# Patient Record
Sex: Male | Born: 1950
Health system: Southern US, Community
[De-identification: ages and names within clinical notes are randomized; demographics above are authoritative.]

## PROBLEM LIST (undated history)

## (undated) DIAGNOSIS — Z8709 Personal history of other diseases of the respiratory system: Secondary | ICD-10-CM

## (undated) DIAGNOSIS — M51379 Other intervertebral disc degeneration, lumbosacral region without mention of lumbar back pain or lower extremity pain: Secondary | ICD-10-CM

## (undated) DIAGNOSIS — M1990 Unspecified osteoarthritis, unspecified site: Secondary | ICD-10-CM

## (undated) DIAGNOSIS — C801 Malignant (primary) neoplasm, unspecified: Secondary | ICD-10-CM

## (undated) DIAGNOSIS — M949 Disorder of cartilage, unspecified: Secondary | ICD-10-CM

## (undated) DIAGNOSIS — R350 Frequency of micturition: Secondary | ICD-10-CM

## (undated) DIAGNOSIS — Z9109 Other allergy status, other than to drugs and biological substances: Secondary | ICD-10-CM

## (undated) DIAGNOSIS — R51 Headache: Secondary | ICD-10-CM

## (undated) DIAGNOSIS — R519 Headache, unspecified: Secondary | ICD-10-CM

## (undated) DIAGNOSIS — M5137 Other intervertebral disc degeneration, lumbosacral region: Secondary | ICD-10-CM

## (undated) DIAGNOSIS — J189 Pneumonia, unspecified organism: Secondary | ICD-10-CM

## (undated) DIAGNOSIS — Z972 Presence of dental prosthetic device (complete) (partial): Secondary | ICD-10-CM

## (undated) DIAGNOSIS — K219 Gastro-esophageal reflux disease without esophagitis: Secondary | ICD-10-CM

## (undated) DIAGNOSIS — M899 Disorder of bone, unspecified: Secondary | ICD-10-CM

## (undated) DIAGNOSIS — H269 Unspecified cataract: Secondary | ICD-10-CM

## (undated) HISTORY — DX: Gastro-esophageal reflux disease without esophagitis: K21.9

## (undated) HISTORY — DX: Other intervertebral disc degeneration, lumbosacral region: M51.37

## (undated) HISTORY — PX: VASECTOMY: SHX75

## (undated) HISTORY — DX: Other intervertebral disc degeneration, lumbosacral region without mention of lumbar back pain or lower extremity pain: M51.379

## (undated) HISTORY — PX: DENTAL SURGERY: SHX609

## (undated) HISTORY — DX: Unspecified osteoarthritis, unspecified site: M19.90

---

## 1999-07-21 ENCOUNTER — Encounter: Admission: RE | Admit: 1999-07-21 | Discharge: 1999-07-21 | Payer: Self-pay | Admitting: *Deleted

## 1999-07-21 ENCOUNTER — Encounter: Payer: Self-pay | Admitting: *Deleted

## 2002-03-05 HISTORY — PX: COLONOSCOPY: SHX174

## 2002-03-17 ENCOUNTER — Ambulatory Visit (HOSPITAL_COMMUNITY): Admission: RE | Admit: 2002-03-17 | Discharge: 2002-03-17 | Payer: Self-pay | Admitting: *Deleted

## 2014-03-13 DIAGNOSIS — J309 Allergic rhinitis, unspecified: Secondary | ICD-10-CM | POA: Diagnosis not present

## 2014-03-13 DIAGNOSIS — J0110 Acute frontal sinusitis, unspecified: Secondary | ICD-10-CM | POA: Diagnosis not present

## 2016-03-03 DIAGNOSIS — J321 Chronic frontal sinusitis: Secondary | ICD-10-CM | POA: Diagnosis not present

## 2016-03-03 DIAGNOSIS — Z23 Encounter for immunization: Secondary | ICD-10-CM | POA: Diagnosis not present

## 2016-04-10 ENCOUNTER — Ambulatory Visit
Admission: RE | Admit: 2016-04-10 | Discharge: 2016-04-10 | Disposition: A | Discharge status: Home / Self Care | Payer: Medicare Other | Source: Ambulatory Visit | Attending: Physician Assistant | Admitting: Physician Assistant

## 2016-04-10 ENCOUNTER — Other Ambulatory Visit: Payer: Self-pay | Admitting: Physician Assistant

## 2016-04-10 DIAGNOSIS — M25571 Pain in right ankle and joints of right foot: Secondary | ICD-10-CM

## 2016-04-10 DIAGNOSIS — E786 Lipoprotein deficiency: Secondary | ICD-10-CM | POA: Diagnosis not present

## 2016-04-10 DIAGNOSIS — Z0000 Encounter for general adult medical examination without abnormal findings: Secondary | ICD-10-CM | POA: Diagnosis not present

## 2016-04-10 DIAGNOSIS — Z23 Encounter for immunization: Secondary | ICD-10-CM | POA: Diagnosis not present

## 2016-04-10 DIAGNOSIS — Z87891 Personal history of nicotine dependence: Secondary | ICD-10-CM | POA: Diagnosis not present

## 2016-04-10 DIAGNOSIS — M19071 Primary osteoarthritis, right ankle and foot: Secondary | ICD-10-CM | POA: Diagnosis not present

## 2016-04-11 ENCOUNTER — Other Ambulatory Visit: Payer: Self-pay | Admitting: Physician Assistant

## 2016-04-11 DIAGNOSIS — Z136 Encounter for screening for cardiovascular disorders: Secondary | ICD-10-CM

## 2016-04-20 ENCOUNTER — Ambulatory Visit: Payer: Medicare Other

## 2016-04-24 ENCOUNTER — Other Ambulatory Visit: Payer: Self-pay | Admitting: Physician Assistant

## 2016-04-24 ENCOUNTER — Ambulatory Visit
Admission: RE | Admit: 2016-04-24 | Discharge: 2016-04-24 | Disposition: A | Discharge status: Home / Self Care | Payer: Medicare Other | Source: Ambulatory Visit | Attending: Physician Assistant | Admitting: Physician Assistant

## 2016-04-24 DIAGNOSIS — Z87891 Personal history of nicotine dependence: Secondary | ICD-10-CM | POA: Diagnosis not present

## 2016-04-24 DIAGNOSIS — Z136 Encounter for screening for cardiovascular disorders: Secondary | ICD-10-CM

## 2016-04-25 ENCOUNTER — Telehealth: Payer: Self-pay

## 2016-04-25 NOTE — Telephone Encounter (Signed)
SENT NOTES TO SCHEDULING 

## 2016-05-01 ENCOUNTER — Encounter: Payer: Self-pay | Admitting: *Deleted

## 2016-05-01 ENCOUNTER — Telehealth: Payer: Self-pay | Admitting: *Deleted

## 2016-05-01 NOTE — Telephone Encounter (Signed)
NOTES SENT TO SCHEDULING ON 05/01/16.

## 2016-05-02 ENCOUNTER — Encounter: Payer: Self-pay | Admitting: Cardiovascular Disease

## 2016-05-02 ENCOUNTER — Ambulatory Visit (INDEPENDENT_AMBULATORY_CARE_PROVIDER_SITE_OTHER): Payer: Medicare Other | Admitting: Cardiovascular Disease

## 2016-05-02 DIAGNOSIS — G8929 Other chronic pain: Secondary | ICD-10-CM | POA: Diagnosis not present

## 2016-05-02 DIAGNOSIS — M25571 Pain in right ankle and joints of right foot: Secondary | ICD-10-CM | POA: Diagnosis not present

## 2016-05-02 NOTE — Patient Instructions (Signed)
Medication Instructions: Your physician recommends that you continue on your current medications as directed. Please refer to the Current Medication list given to you today.   Follow-Up: Your physician recommends that you schedule a follow-up appointment as needed with Dr. Berry.  If you need a refill on your cardiac medications before your next appointment, please call your pharmacy.  

## 2016-05-02 NOTE — Assessment & Plan Note (Signed)
David Dillon was referred to me for evaluation of right ankle pain because of radiographic evidence of arterial calcification in his right ankle. He has no cardiac risk factors. His ankle hurt at any time mostly in the mornings before he walks on it gets better throughout the day. He denies claudication symptoms. Radiographs suggested calcification of his tibial vessels. On exam today his blood bounding pedal pulses. I do not think his symptoms are vascular in nature.

## 2016-05-02 NOTE — Progress Notes (Signed)
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 He is referred for evaluation of chronic episodic right ankle pain. He really has no chronic or factors other than family history with a father who had bypass surgery in his 18s. He has never had a heart attack or stroke. He denies chest pain or shortness of breath. He is fairly active and used to run 5K's and 40 miles a week. He only injured his ankle 5 years ago and gets episodic right ankle pain which gets better with ambulation. Radiograph recently performed by his PCP suggested vascular calcification and he was referred here for further evaluation. He denies claudication.   Current Outpatient Prescriptions  Medication Sig Dispense Refill  . cetirizine (ZYRTEC) 10 MG tablet Take 10 mg by mouth daily as needed for allergies.    . fluticasone (FLONASE) 50 MCG/ACT nasal spray Place 2 sprays into both nostrils daily as needed for allergies or rhinitis.    . ranitidine (ZANTAC) 150 MG capsule Take 150 mg by mouth daily as needed for heartburn.     No current facility-administered medications for this visit.     No Known Allergies  Social History   Social History  . Marital status: Married    Spouse name: N/A  . Number of children: 3  . Years of education: N/A   Occupational History  . Not on file.   Social History Main Topics  . Smoking status: Former Smoker    Types: Cigarettes  . Smokeless tobacco: Never Used     Comment: quit in his early 20's  . Alcohol use Yes     Comment: daily  . Drug use: No  . Sexual activity: Not on file   Other Topics Concern  . Not on file   Social History Narrative  . No narrative  on file     Review of Systems: General: negative for chills, fever, night sweats or weight changes.  Cardiovascular: negative for chest pain, dyspnea on exertion, edema, orthopnea, palpitations, paroxysmal nocturnal dyspnea or shortness of breath Dermatological: negative for rash Respiratory: negative for cough or wheezing Urologic: negative for hematuria Abdominal: negative for nausea, vomiting, diarrhea, bright red blood per rectum, melena, or hematemesis Neurologic: negative for visual changes, syncope, or dizziness All other systems reviewed and are otherwise negative except as noted above.    There were no vitals taken for this visit.  General appearance: alert and no distress Neck: no adenopathy, no carotid bruit, no JVD, supple, symmetrical, trachea midline and thyroid not enlarged, symmetric, no tenderness/mass/nodules Lungs: clear to auscultation bilaterally Heart: regular rate and rhythm, S1, S2 normal, no murmur, click, rub or gallop Extremities: extremities normal, atraumatic, no cyanosis or edema and 2+ right pedal pulses  EKG sinus rhythm at 65 with left axis deviation. I personally reviewed this EKG  ASSESSMENT AND PLAN:   Right ankle pain Mr. Groman was referred to me for evaluation of right ankle pain because of radiographic evidence of arterial calcification in his right ankle. He has no cardiac risk factors. His ankle hurt at any time mostly in the mornings before he walks on it gets better throughout the day. He denies claudication symptoms. Radiographs suggested  calcification of his tibial vessels. On exam today his blood bounding pedal pulses. I do not think his symptoms are vascular in nature.      Lorretta Harp MD FACP,FACC,FAHA, [facility] 05/02/2016 10:50 AM

## 2017-01-25 DIAGNOSIS — J019 Acute sinusitis, unspecified: Secondary | ICD-10-CM | POA: Diagnosis not present

## 2017-02-04 DIAGNOSIS — Z23 Encounter for immunization: Secondary | ICD-10-CM | POA: Diagnosis not present

## 2017-04-11 DIAGNOSIS — E786 Lipoprotein deficiency: Secondary | ICD-10-CM | POA: Diagnosis not present

## 2017-04-11 DIAGNOSIS — K219 Gastro-esophageal reflux disease without esophagitis: Secondary | ICD-10-CM | POA: Diagnosis not present

## 2017-04-11 DIAGNOSIS — Z131 Encounter for screening for diabetes mellitus: Secondary | ICD-10-CM | POA: Diagnosis not present

## 2017-04-11 DIAGNOSIS — M25571 Pain in right ankle and joints of right foot: Secondary | ICD-10-CM | POA: Diagnosis not present

## 2017-04-11 DIAGNOSIS — Z0000 Encounter for general adult medical examination without abnormal findings: Secondary | ICD-10-CM | POA: Diagnosis not present

## 2017-06-04 DIAGNOSIS — G8929 Other chronic pain: Secondary | ICD-10-CM | POA: Diagnosis not present

## 2017-06-04 DIAGNOSIS — M949 Disorder of cartilage, unspecified: Secondary | ICD-10-CM | POA: Diagnosis not present

## 2017-06-04 DIAGNOSIS — M899 Disorder of bone, unspecified: Secondary | ICD-10-CM | POA: Diagnosis not present

## 2017-06-04 DIAGNOSIS — M25571 Pain in right ankle and joints of right foot: Secondary | ICD-10-CM | POA: Diagnosis not present

## 2017-06-13 DIAGNOSIS — M25571 Pain in right ankle and joints of right foot: Secondary | ICD-10-CM | POA: Diagnosis not present

## 2017-06-22 DIAGNOSIS — M25571 Pain in right ankle and joints of right foot: Secondary | ICD-10-CM | POA: Diagnosis not present

## 2017-06-22 DIAGNOSIS — M216X1 Other acquired deformities of right foot: Secondary | ICD-10-CM | POA: Diagnosis not present

## 2017-07-17 ENCOUNTER — Other Ambulatory Visit: Payer: Self-pay | Admitting: Orthopedic Surgery

## 2017-08-03 DIAGNOSIS — M899 Disorder of bone, unspecified: Secondary | ICD-10-CM

## 2017-08-03 HISTORY — DX: Disorder of bone, unspecified: M89.9

## 2017-08-16 ENCOUNTER — Encounter (HOSPITAL_BASED_OUTPATIENT_CLINIC_OR_DEPARTMENT_OTHER): Payer: Self-pay | Admitting: *Deleted

## 2017-08-16 ENCOUNTER — Other Ambulatory Visit: Payer: Self-pay

## 2017-08-22 NOTE — Anesthesia Preprocedure Evaluation (Signed)
Anesthesia Evaluation  Patient identified by MRN, date of birth, ID band Patient awake    Reviewed: Allergy & Precautions, NPO status , Patient's Chart, lab work & pertinent test results  Airway Mallampati: II  TM Distance: >3 FB Neck ROM: Full    Dental no notable dental hx.    Pulmonary former smoker,    Pulmonary exam normal breath sounds clear to auscultation       Cardiovascular negative cardio ROS Normal cardiovascular exam Rhythm:Regular Rate:Normal     Neuro/Psych  Headaches, negative psych ROS   GI/Hepatic Neg liver ROS, GERD  ,  Endo/Other  negative endocrine ROS  Renal/GU negative Renal ROS  negative genitourinary   Musculoskeletal  (+) Arthritis , Osteoarthritis,    Abdominal   Peds negative pediatric ROS (+)  Hematology negative hematology ROS (+)   Anesthesia Other Findings   Reproductive/Obstetrics negative OB ROS                             Anesthesia Physical Anesthesia Plan  ASA: II  Anesthesia Plan: General   Post-op Pain Management: GA combined w/ Regional for post-op pain   Induction:   PONV Risk Score and Plan: 2 and Ondansetron and Treatment may vary due to age or medical condition  Airway Management Planned: LMA  Additional Equipment:   Intra-op Plan:   Post-operative Plan: Extubation in OR  Informed Consent: I have reviewed the patients History and Physical, chart, labs and discussed the procedure including the risks, benefits and alternatives for the proposed anesthesia with the patient or authorized representative who has indicated his/her understanding and acceptance.   Dental advisory given  Plan Discussed with: CRNA, Surgeon and Anesthesiologist  Anesthesia Plan Comments: (Discussed both nerve block for pain relief post-op and GA; including NV, sore throat, dental injury, and pulmonary complications)        Anesthesia Quick  Evaluation

## 2017-08-23 ENCOUNTER — Encounter (HOSPITAL_BASED_OUTPATIENT_CLINIC_OR_DEPARTMENT_OTHER): Payer: Self-pay | Admitting: Certified Registered"

## 2017-08-23 ENCOUNTER — Ambulatory Visit (HOSPITAL_BASED_OUTPATIENT_CLINIC_OR_DEPARTMENT_OTHER)
Admission: RE | Admit: 2017-08-23 | Discharge: 2017-08-23 | Disposition: A | Discharge status: Home / Self Care | Payer: Medicare Other | Source: Ambulatory Visit | Attending: Orthopedic Surgery | Admitting: Orthopedic Surgery

## 2017-08-23 ENCOUNTER — Ambulatory Visit (HOSPITAL_BASED_OUTPATIENT_CLINIC_OR_DEPARTMENT_OTHER): Payer: Medicare Other | Admitting: Anesthesiology

## 2017-08-23 ENCOUNTER — Encounter (HOSPITAL_BASED_OUTPATIENT_CLINIC_OR_DEPARTMENT_OTHER)
Admission: RE | Disposition: A | Discharge status: Home / Self Care | Payer: Self-pay | Source: Ambulatory Visit | Attending: Orthopedic Surgery

## 2017-08-23 ENCOUNTER — Other Ambulatory Visit: Payer: Self-pay

## 2017-08-23 DIAGNOSIS — Z7951 Long term (current) use of inhaled steroids: Secondary | ICD-10-CM | POA: Diagnosis not present

## 2017-08-23 DIAGNOSIS — Z87891 Personal history of nicotine dependence: Secondary | ICD-10-CM | POA: Diagnosis not present

## 2017-08-23 DIAGNOSIS — M19041 Primary osteoarthritis, right hand: Secondary | ICD-10-CM | POA: Insufficient documentation

## 2017-08-23 DIAGNOSIS — K219 Gastro-esophageal reflux disease without esophagitis: Secondary | ICD-10-CM | POA: Diagnosis not present

## 2017-08-23 DIAGNOSIS — Z79899 Other long term (current) drug therapy: Secondary | ICD-10-CM | POA: Diagnosis not present

## 2017-08-23 DIAGNOSIS — M93271 Osteochondritis dissecans, right ankle and joints of right foot: Secondary | ICD-10-CM | POA: Diagnosis not present

## 2017-08-23 DIAGNOSIS — M19042 Primary osteoarthritis, left hand: Secondary | ICD-10-CM | POA: Diagnosis not present

## 2017-08-23 DIAGNOSIS — Z823 Family history of stroke: Secondary | ICD-10-CM | POA: Diagnosis not present

## 2017-08-23 DIAGNOSIS — M24871 Other specific joint derangements of right ankle, not elsewhere classified: Secondary | ICD-10-CM | POA: Diagnosis not present

## 2017-08-23 DIAGNOSIS — M65871 Other synovitis and tenosynovitis, right ankle and foot: Secondary | ICD-10-CM | POA: Diagnosis not present

## 2017-08-23 DIAGNOSIS — M94271 Chondromalacia, right ankle and joints of right foot: Secondary | ICD-10-CM | POA: Insufficient documentation

## 2017-08-23 DIAGNOSIS — G8918 Other acute postprocedural pain: Secondary | ICD-10-CM | POA: Diagnosis not present

## 2017-08-23 DIAGNOSIS — M899 Disorder of bone, unspecified: Secondary | ICD-10-CM | POA: Insufficient documentation

## 2017-08-23 DIAGNOSIS — M659 Synovitis and tenosynovitis, unspecified: Secondary | ICD-10-CM | POA: Insufficient documentation

## 2017-08-23 DIAGNOSIS — M5137 Other intervertebral disc degeneration, lumbosacral region: Secondary | ICD-10-CM | POA: Diagnosis not present

## 2017-08-23 DIAGNOSIS — M889 Osteitis deformans of unspecified bone: Secondary | ICD-10-CM | POA: Diagnosis present

## 2017-08-23 HISTORY — DX: Headache: R51

## 2017-08-23 HISTORY — DX: Headache, unspecified: R51.9

## 2017-08-23 HISTORY — DX: Other allergy status, other than to drugs and biological substances: Z91.09

## 2017-08-23 HISTORY — DX: Frequency of micturition: R35.0

## 2017-08-23 HISTORY — PX: ANKLE ARTHROSCOPY WITH DRILLING/MICROFRACTURE: SHX5580

## 2017-08-23 HISTORY — DX: Disorder of cartilage, unspecified: M94.9

## 2017-08-23 HISTORY — DX: Disorder of bone, unspecified: M89.9

## 2017-08-23 HISTORY — DX: Presence of dental prosthetic device (complete) (partial): Z97.2

## 2017-08-23 HISTORY — DX: Personal history of other diseases of the respiratory system: Z87.09

## 2017-08-23 HISTORY — DX: Unspecified cataract: H26.9

## 2017-08-23 SURGERY — ARTHROSCOPY, ANKLE, WITH MICROFRACTURE
Anesthesia: General | Site: Ankle | Laterality: Right

## 2017-08-23 MED ORDER — BUPIVACAINE-EPINEPHRINE (PF) 0.5% -1:200000 IJ SOLN
INTRAMUSCULAR | Status: AC
  Filled 2017-08-23: qty 30

## 2017-08-23 MED ORDER — OXYCODONE HCL 5 MG PO TABS: 5.0000 mg | ORAL_TABLET | Freq: Once | ORAL | Status: DC | PRN

## 2017-08-23 MED ORDER — FENTANYL CITRATE (PF) 100 MCG/2ML IJ SOLN
INTRAMUSCULAR | Status: AC
  Filled 2017-08-23: qty 2

## 2017-08-23 MED ORDER — CEFAZOLIN SODIUM-DEXTROSE 2-4 GM/100ML-% IV SOLN
INTRAVENOUS | Status: AC
  Filled 2017-08-23: qty 100

## 2017-08-23 MED ORDER — CEFAZOLIN SODIUM-DEXTROSE 2-4 GM/100ML-% IV SOLN
2.0000 g | INTRAVENOUS | Status: AC
  Administered 2017-08-23: 2 g via INTRAVENOUS

## 2017-08-23 MED ORDER — BUPIVACAINE HCL (PF) 0.75 % IJ SOLN
INTRAMUSCULAR | Status: DC | PRN
  Administered 2017-08-23: 25 mL

## 2017-08-23 MED ORDER — SODIUM CHLORIDE 0.9 % IV SOLN: INTRAVENOUS | Status: DC

## 2017-08-23 MED ORDER — LIDOCAINE HCL (CARDIAC) 20 MG/ML IV SOLN
INTRAVENOUS | Status: DC | PRN
  Administered 2017-08-23: 30 mg via INTRAVENOUS

## 2017-08-23 MED ORDER — DOCUSATE SODIUM 100 MG PO CAPS: 100.0000 mg | ORAL_CAPSULE | Freq: Two times a day (BID) | ORAL | 0 refills | Status: DC

## 2017-08-23 MED ORDER — DEXAMETHASONE SODIUM PHOSPHATE 10 MG/ML IJ SOLN
INTRAMUSCULAR | Status: DC | PRN
  Administered 2017-08-23: 10 mg via INTRAVENOUS

## 2017-08-23 MED ORDER — KETOROLAC TROMETHAMINE 30 MG/ML IJ SOLN: 30.0000 mg | Freq: Once | INTRAMUSCULAR | Status: DC | PRN

## 2017-08-23 MED ORDER — FENTANYL CITRATE (PF) 100 MCG/2ML IJ SOLN
25.0000 ug | INTRAMUSCULAR | Status: DC | PRN
  Administered 2017-08-23: 25 ug via INTRAVENOUS

## 2017-08-23 MED ORDER — SODIUM CHLORIDE 0.9 % IR SOLN
Status: DC | PRN
  Administered 2017-08-23: 3000 mL

## 2017-08-23 MED ORDER — ONDANSETRON HCL 4 MG/2ML IJ SOLN
INTRAMUSCULAR | Status: DC | PRN
Start: 2017-08-23 — End: 2017-08-23
  Administered 2017-08-23: 4 mg via INTRAVENOUS

## 2017-08-23 MED ORDER — MIDAZOLAM HCL 2 MG/2ML IJ SOLN
INTRAMUSCULAR | Status: AC
  Filled 2017-08-23: qty 2

## 2017-08-23 MED ORDER — CHLORHEXIDINE GLUCONATE 4 % EX LIQD: 60.0000 mL | Freq: Once | CUTANEOUS | Status: DC

## 2017-08-23 MED ORDER — ONDANSETRON HCL 4 MG/2ML IJ SOLN: 4.0000 mg | Freq: Once | INTRAMUSCULAR | Status: DC | PRN

## 2017-08-23 MED ORDER — PROPOFOL 10 MG/ML IV BOLUS
INTRAVENOUS | Status: DC | PRN
  Administered 2017-08-23: 150 mg via INTRAVENOUS

## 2017-08-23 MED ORDER — FENTANYL CITRATE (PF) 100 MCG/2ML IJ SOLN
50.0000 ug | INTRAMUSCULAR | Status: DC | PRN
  Administered 2017-08-23: 100 ug via INTRAVENOUS

## 2017-08-23 MED ORDER — SENNA 8.6 MG PO TABS: 2.0000 | ORAL_TABLET | Freq: Two times a day (BID) | ORAL | 0 refills | Status: DC

## 2017-08-23 MED ORDER — ACETAMINOPHEN 325 MG PO TABS: 325.0000 mg | ORAL_TABLET | ORAL | Status: DC | PRN

## 2017-08-23 MED ORDER — OXYCODONE HCL 5 MG PO TABS: 5.0000 mg | ORAL_TABLET | ORAL | 0 refills | Status: DC | PRN

## 2017-08-23 MED ORDER — ACETAMINOPHEN 160 MG/5ML PO SOLN: 325.0000 mg | ORAL | Status: DC | PRN

## 2017-08-23 MED ORDER — LACTATED RINGERS IV SOLN
INTRAVENOUS | Status: DC
  Administered 2017-08-23 (×2): via INTRAVENOUS

## 2017-08-23 MED ORDER — MIDAZOLAM HCL 2 MG/2ML IJ SOLN
1.0000 mg | INTRAMUSCULAR | Status: DC | PRN
  Administered 2017-08-23: 2 mg via INTRAVENOUS

## 2017-08-23 MED ORDER — OXYCODONE HCL 5 MG/5ML PO SOLN: 5.0000 mg | Freq: Once | ORAL | Status: DC | PRN

## 2017-08-23 MED ORDER — SCOPOLAMINE 1 MG/3DAYS TD PT72: 1.0000 | MEDICATED_PATCH | Freq: Once | TRANSDERMAL | Status: DC | PRN

## 2017-08-23 MED ORDER — MEPERIDINE HCL 25 MG/ML IJ SOLN: 6.2500 mg | INTRAMUSCULAR | Status: DC | PRN

## 2017-08-23 SURGICAL SUPPLY — 81 items
BANDAGE ACE 4X5 VEL STRL LF (GAUZE/BANDAGES/DRESSINGS) ×3 IMPLANT
BANDAGE ESMARK 6X9 LF (GAUZE/BANDAGES/DRESSINGS) IMPLANT
BLADE CUDA 2.0 (BLADE) IMPLANT
BLADE CUDA GRT WHITE 3.5 (BLADE) ×3 IMPLANT
BLADE CUDA SHAVER 3.5 (BLADE) IMPLANT
BLADE CUTTER GATOR 3.5 (BLADE) IMPLANT
BLADE SURG 15 STRL LF DISP TIS (BLADE) ×1 IMPLANT
BLADE SURG 15 STRL SS (BLADE) ×2
BNDG COHESIVE 4X5 TAN STRL (GAUZE/BANDAGES/DRESSINGS) IMPLANT
BNDG COHESIVE 6X5 TAN STRL LF (GAUZE/BANDAGES/DRESSINGS) IMPLANT
BNDG ESMARK 6X9 LF (GAUZE/BANDAGES/DRESSINGS)
BNDG GAUZE ELAST 4 BULKY (GAUZE/BANDAGES/DRESSINGS) ×3 IMPLANT
BOOT STEPPER DURA LG (SOFTGOODS) ×3 IMPLANT
BOOT STEPPER DURA MED (SOFTGOODS) IMPLANT
BOOT STEPPER DURA SM (SOFTGOODS) IMPLANT
BUR 3.5 LG SPHERICAL (BURR) IMPLANT
BUR CUDA 2.9 (BURR) ×2 IMPLANT
BUR CUDA 2.9MM (BURR) ×1
BUR FULL RADIUS 2.9 (BURR) IMPLANT
BUR FULL RADIUS 2.9MM (BURR)
BUR GATOR 2.9 (BURR) IMPLANT
BUR GATOR 2.9MM (BURR)
BUR OVAL 4.0 (BURR) IMPLANT
BUR SPHERICAL 2.9 (BURR) IMPLANT
BUR SPHERICAL 2.9MM (BURR)
BUR VERTEX HOODED 4.5 (BURR) IMPLANT
BURR 3.5 LG SPHERICAL (BURR)
BURR 3.5MM LG SPHERICAL (BURR)
CHLORAPREP W/TINT 26ML (MISCELLANEOUS) ×3 IMPLANT
CUFF TOURNIQUET SINGLE 34IN LL (TOURNIQUET CUFF) IMPLANT
DRAPE EXTREMITY T 121X128X90 (DRAPE) ×3 IMPLANT
DRAPE OEC MINIVIEW 54X84 (DRAPES) ×3 IMPLANT
DRAPE U-SHAPE 47X51 STRL (DRAPES) ×3 IMPLANT
DRSG MEPITEL 4X7.2 (GAUZE/BANDAGES/DRESSINGS) ×3 IMPLANT
DRSG PAD ABDOMINAL 8X10 ST (GAUZE/BANDAGES/DRESSINGS) ×3 IMPLANT
ELECT REM PT RETURN 9FT ADLT (ELECTROSURGICAL) ×3
ELECTRODE REM PT RTRN 9FT ADLT (ELECTROSURGICAL) ×1 IMPLANT
GAUZE SPONGE 4X4 12PLY STRL (GAUZE/BANDAGES/DRESSINGS) ×3 IMPLANT
GLOVE BIO SURGEON STRL SZ8 (GLOVE) ×3 IMPLANT
GLOVE BIOGEL PI IND STRL 7.0 (GLOVE) ×2 IMPLANT
GLOVE BIOGEL PI IND STRL 8 (GLOVE) ×2 IMPLANT
GLOVE BIOGEL PI INDICATOR 7.0 (GLOVE) ×4
GLOVE BIOGEL PI INDICATOR 8 (GLOVE) ×4
GLOVE ECLIPSE 6.5 STRL STRAW (GLOVE) ×3 IMPLANT
GLOVE ECLIPSE 8.0 STRL XLNG CF (GLOVE) ×3 IMPLANT
GOWN STRL REUS W/ TWL LRG LVL3 (GOWN DISPOSABLE) ×1 IMPLANT
GOWN STRL REUS W/ TWL XL LVL3 (GOWN DISPOSABLE) ×2 IMPLANT
GOWN STRL REUS W/TWL LRG LVL3 (GOWN DISPOSABLE) ×2
GOWN STRL REUS W/TWL XL LVL3 (GOWN DISPOSABLE) ×4
IMPLANT SCP KIT FOOT ANKLE 3 (Orthopedic Implant) ×3 IMPLANT
KIT ACCUFILL 3CC (Orthopedic Implant) ×1 IMPLANT
MANIFOLD NEPTUNE II (INSTRUMENTS) ×3 IMPLANT
PACK ARTHROSCOPY DSU (CUSTOM PROCEDURE TRAY) ×3 IMPLANT
PACK BASIN DAY SURGERY FS (CUSTOM PROCEDURE TRAY) ×3 IMPLANT
PAD CAST 4YDX4 CTTN HI CHSV (CAST SUPPLIES) ×1 IMPLANT
PADDING CAST ABS 4INX4YD NS (CAST SUPPLIES)
PADDING CAST ABS COTTON 4X4 ST (CAST SUPPLIES) IMPLANT
PADDING CAST COTTON 4X4 STRL (CAST SUPPLIES) ×2
PADDING CAST COTTON 6X4 STRL (CAST SUPPLIES) IMPLANT
PENCIL BUTTON HOLSTER BLD 10FT (ELECTRODE) IMPLANT
ROUTER HOODED VORTEX 2.9MM (BLADE) IMPLANT
SANITIZER HAND PURELL 535ML FO (MISCELLANEOUS) ×3 IMPLANT
SET IRRIG Y TYPE TUR BLADDER L (SET/KITS/TRAYS/PACK) IMPLANT
SLEEVE SCD COMPRESS KNEE MED (MISCELLANEOUS) ×3 IMPLANT
SPLINT FAST PLASTER 5X30 (CAST SUPPLIES)
SPLINT PLASTER CAST FAST 5X30 (CAST SUPPLIES) IMPLANT
SPONGE LAP 18X18 RF (DISPOSABLE) ×3 IMPLANT
STOCKINETTE 6  STRL (DRAPES) ×2
STOCKINETTE 6 STRL (DRAPES) ×1 IMPLANT
STRAP ANKLE FOOT DISTRACTOR (ORTHOPEDIC SUPPLIES) ×3 IMPLANT
SUT ETHILON 3 0 PS 1 (SUTURE) ×3 IMPLANT
SUT MNCRL AB 3-0 PS2 18 (SUTURE) IMPLANT
SUT VIC AB 0 SH 27 (SUTURE) IMPLANT
SUT VIC AB 2-0 SH 27 (SUTURE)
SUT VIC AB 2-0 SH 27XBRD (SUTURE) IMPLANT
SYR BULB 3OZ (MISCELLANEOUS) IMPLANT
TOWEL OR 17X24 6PK STRL BLUE (TOWEL DISPOSABLE) ×3 IMPLANT
TUBE CONNECTING 20'X1/4 (TUBING)
TUBE CONNECTING 20X1/4 (TUBING) IMPLANT
TUBING ARTHRO INFLOW-ONLY STRL (TUBING) ×3 IMPLANT
WATER STERILE IRR 1000ML POUR (IV SOLUTION) ×3 IMPLANT

## 2017-08-23 NOTE — Anesthesia Procedure Notes (Signed)
Procedure Name: LMA Insertion Date/Time: 08/23/2017 7:34 AM Performed by: Signe Colt, CRNA Pre-anesthesia Checklist: Patient identified, Emergency Drugs available, Suction available and Patient being monitored Patient Re-evaluated:Patient Re-evaluated prior to induction Oxygen Delivery Method: Circle system utilized Preoxygenation: Pre-oxygenation with 100% oxygen Induction Type: IV induction Ventilation: Mask ventilation without difficulty LMA: LMA inserted LMA Size: 4.0 Number of attempts: 1 Airway Equipment and Method: Bite block Placement Confirmation: positive ETCO2 Tube secured with: Tape Dental Injury: Teeth and Oropharynx as per pre-operative assessment

## 2017-08-23 NOTE — Discharge Instructions (Addendum)
David Simmer, MD [place]  Please read the following information regarding your care after surgery.  Medications  You only need a prescription for the narcotic pain medicine (ex. oxycodone, Percocet, Norco).  All of the other medicines listed below are available over the counter. X Aleve or Naproxyn 2 pills twice a day for the first 3 days after surgery. X acetominophen (Tylenol) 650 mg every 4-6 hours as you need for minor to moderate pain X oxycodone as prescribed for severe pain  Narcotic pain medicine (ex. oxycodone, Percocet, Vicodin) will cause constipation.  To prevent this problem, take the following medicines while you are taking any pain medicine. X docusate sodium (Colace) 100 mg twice a day X senna (Senokot) 2 tablets twice a day  Weight Bearing X Bear weight when you are able on your operated leg or foot in the CAM boot.  Cast / Splint / Dressing X Remove your dressing 3 days after surgery and cover the incisions with dry dressings.    After your dressing, cast or splint is removed; you may shower, but do not soak or scrub the wound.  Allow the water to run over it, and then gently pat it dry.  Swelling It is normal for you to have swelling where you had surgery.  To reduce swelling and pain, keep your toes above your nose for at least 3 days after surgery.  It may be necessary to keep your foot or leg elevated for several weeks.  If it hurts, it should be elevated.  Follow Up Call my office at [phone] when you are discharged from the hospital or surgery center to schedule an appointment to be seen two weeks after surgery.  Call my office at [phone] if you develop a fever >101.5 F, nausea, vomiting, bleeding from the surgical site or severe pain.          Post Anesthesia Home Care Instructions  Activity: Get plenty of rest for the remainder of the day. A responsible individual must stay with you for 24 hours following the procedure.  For  the next 24 hours, DO NOT: -Drive a car -Paediatric nurse -Drink alcoholic beverages -Take any medication unless instructed by your physician -Make any legal decisions or sign important papers.  Meals: Start with liquid foods such as gelatin or soup. Progress to regular foods as tolerated. Avoid greasy, spicy, heavy foods. If nausea and/or vomiting occur, drink only clear liquids until the nausea and/or vomiting subsides. Call your physician if vomiting continues.  Special Instructions/Symptoms: Your throat may feel dry or sore from the anesthesia or the breathing tube placed in your throat during surgery. If this causes discomfort, gargle with warm salt water. The discomfort should disappear within 24 hours.  If you had a scopolamine patch placed behind your ear for the management of post- operative nausea and/or vomiting:  1. The medication in the patch is effective for 72 hours, after which it should be removed.  Wrap patch in a tissue and discard in the trash. Wash hands thoroughly with soap and water. 2. You may remove the patch earlier than 72 hours if you experience unpleasant side effects which may include dry mouth, dizziness or visual disturbances. 3. Avoid touching the patch. Wash your hands with soap and water after contact with the patch.       Regional Anesthesia Blocks  1. Numbness or the inability to move the "blocked" extremity may last from 3-48 hours after placement. The length of time depends on the  medication injected and your individual response to the medication. If the numbness is not going away after 48 hours, call your surgeon.  2. The extremity that is blocked will need to be protected until the numbness is gone and the  Strength has returned. Because you cannot feel it, you will need to take extra care to avoid injury. Because it may be weak, you may have difficulty moving it or using it. You may not know what position it is in without looking at it while the  block is in effect.  3. For blocks in the legs and feet, returning to weight bearing and walking needs to be done carefully. You will need to wait until the numbness is entirely gone and the strength has returned. You should be able to move your leg and foot normally before you try and bear weight or walk. You will need someone to be with you when you first try to ensure you do not fall and possibly risk injury.  4. Bruising and tenderness at the needle site are common side effects and will resolve in a few days.  5. Persistent numbness or new problems with movement should be communicated to the surgeon or the [facility] [phone]).

## 2017-08-23 NOTE — Anesthesia Procedure Notes (Addendum)
Anesthesia Regional Block: Pectoralis block   Pre-Anesthetic Checklist: ,, timeout performed, Correct Patient, Correct Site, Correct Laterality, Correct Procedure, Correct Position, site marked, Risks and benefits discussed,  Surgical consent,  Pre-op evaluation,  At surgeon's request and post-op pain management  Laterality: Right  Prep: chloraprep       Needles:  Injection technique: Single-shot  Needle Type: Echogenic Stimulator Needle     Needle Length: 5cm  Needle Gauge: 22     Additional Needles:   Procedures:, nerve stimulator,,, ultrasound used (permanent image in chart),,,,   Nerve Stimulator or Paresthesia:  Response: gastroc, 0.45 mA,   Additional Responses:   Narrative:  Start time: 08/23/2017 7:08 AM End time: 08/23/2017 7:13 AM Injection made incrementally with aspirations every 5 mL.  Performed by: Personally  Anesthesiologist: Janeece Riggers, MD  Additional Notes: Functioning IV was confirmed and monitors were applied.  A 71mm 22ga Arrow echogenic stimulator needle was used. Sterile prep and drape,hand hygiene and sterile gloves were used. Ultrasound guidance: relevant anatomy identified, needle position confirmed, local anesthetic spread visualized around nerve(s)., vascular puncture avoided.  Image printed for medical record. Negative aspiration and negative test dose prior to incremental administration of local anesthetic. The patient tolerated the procedure well.

## 2017-08-23 NOTE — Anesthesia Postprocedure Evaluation (Signed)
Anesthesia Post Note  Patient: David Dillon  Procedure(s) Performed: Ankle Arthroscopy with Extensive Debridement and Grafting of the Osteochondral Lesions of Talus (Right Ankle)     Patient location during evaluation: PACU Anesthesia Type: General Level of consciousness: awake and alert Pain management: pain level controlled Vital Signs Assessment: post-procedure vital signs reviewed and stable Respiratory status: spontaneous breathing, nonlabored ventilation, respiratory function stable and patient connected to nasal cannula oxygen Cardiovascular status: blood pressure returned to baseline and stable Postop Assessment: no apparent nausea or vomiting Anesthetic complications: no    Last Vitals:  Vitals Value Taken Time  BP    Temp    Pulse    Resp    SpO2      Last Pain:  Vitals:   08/23/17 0956  TempSrc: Axillary  PainSc: 2                  Jequan Shahin

## 2017-08-23 NOTE — H&P (Signed)
David Dillon is an 67 y.o. male.   Chief Complaint: right ankle pain HPI: The patient is a 67 year old male with a long history of right ankle pain due to an osteochondral lesion of the his medial talar dome.  He has failed nonoperative treatment to date including activity modification, oral anti-inflammatories, bracing and therapy.  He presents now for operative treatment of this painful condition.  Past Medical History:  Diagnosis Date  . DDD (degenerative disc disease), lumbosacral   . Dental bridge present    lower  . Environmental allergies    pollen, grass, trees  . GERD (gastroesophageal reflux disease)   . History of asthma    > 30 years ago  . Immature cataract of both eyes   . OA (osteoarthritis)    bilateral hand  . Osteochondral lesion of talar dome 08/2017   right  . Sinus headache   . Urinary frequency     Past Surgical History:  Procedure Laterality Date  . COLONOSCOPY  03/2002  . DENTAL SURGERY    . VASECTOMY      Family History  Problem Relation Age of Onset  . Diabetes Mother   . Breast cancer Mother   . Heart attack Father 28  . Breast cancer Sister   . CVA Maternal Grandmother 84  . Colon cancer Maternal Uncle   . Melanoma Daughter    Social History:  reports that he quit smoking about 34 years ago. He has never used smokeless tobacco. He reports that he drinks alcohol. He reports that he does not use drugs.  Allergies: No Known Allergies  Medications Prior to Admission  Medication Sig Dispense Refill  . cetirizine (ZYRTEC) 10 MG tablet Take 10 mg by mouth daily as needed for allergies.    . fluticasone (FLONASE) 50 MCG/ACT nasal spray Place 2 sprays into both nostrils daily as needed for allergies or rhinitis.    . naproxen sodium (ALEVE) 220 MG tablet Take 220 mg by mouth.    . ranitidine (ZANTAC) 150 MG capsule Take 150 mg by mouth daily as needed for heartburn.      No results found for this or any previous visit (from the past 48  hour(s)). No results found.  ROS no recent fever, chills, nausea, vomiting or changes in his appetite  Blood pressure 139/73, pulse 61, temperature 97.7 F (36.5 C), temperature source Oral, resp. rate 18, height 5\' 10"  (1.778 m), weight 89.8 kg (198 lb), SpO2 98 %. Physical Exam  Well-nourished well-developed man in no apparent distress.  Alert and oriented x4.  Mood and affect are normal.  Extraocular motions are intact.  Respirations are unlabored.  Gait is normal.  Right ankle has healthy skin.  No lymphadenopathy.  Pulses are palpable.  Tender to palpation at the anteromedial joint line.  Sensibility to light touch is intact at the dorsal and plantar forefoot.  5 out of 5 strength in plantar flexion, dorsi flexion, inversion and eversion.  Assessment/Plan Right talus osteochondral lesion and ankle synovitis -to the operating room today for right ankle arthroscopy with debridement and bone grafting of the medial talar dome osteochondral lesion. The risks and benefits of the alternative treatment options have been discussed in detail.  The patient wishes to proceed with surgery and specifically understands risks of bleeding, infection, nerve damage, blood clots, need for additional surgery, amputation and death.   Wylene Simmer, MD 08-24-2017, 7:19 AM

## 2017-08-23 NOTE — Progress Notes (Signed)
Assisted Dr. Oddono with right, ultrasound guided, popliteal block. Side rails up, monitors on throughout procedure. See vital signs in flow sheet. Tolerated Procedure well. 

## 2017-08-23 NOTE — Transfer of Care (Signed)
Immediate Anesthesia Transfer of Care Note  Patient: David Dillon  Procedure(s) Performed: Ankle Arthroscopy with Extensive Debridement and Grafting of the Osteochondral Lesions of Talus (Right Ankle)  Patient Location: PACU  Anesthesia Type:GA combined with regional for post-op pain  Level of Consciousness: awake and patient cooperative  Airway & Oxygen Therapy: Patient Spontanous Breathing and Patient connected to face mask oxygen  Post-op Assessment: Report given to RN and Post -op Vital signs reviewed and stable  Post vital signs: Reviewed and stable  Last Vitals:  Vitals Value Taken Time  BP 122/61 08/23/2017  8:36 AM  Temp    Pulse 65 08/23/2017  8:38 AM  Resp 13 08/23/2017  8:38 AM  SpO2 100 % 08/23/2017  8:38 AM  Vitals shown include unvalidated device data.  Last Pain:  Vitals:   08/23/17 0628  TempSrc: Oral  PainSc: 2       Patients Stated Pain Goal: 0 (11/26/74 2831)  Complications: No apparent anesthesia complications

## 2017-08-23 NOTE — Op Note (Signed)
08/23/2017  8:35 AM  PATIENT:  David Dillon  67 y.o. male  PRE-OPERATIVE DIAGNOSIS:  Right ankle osteochondral lesion of talus and synovitis  POST-OPERATIVE DIAGNOSIS: 1.  Right ankle synovitis      2.  Osteochondral lesion of the medial talar dome      3.  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  Arthroscopic treatment of the right talar dome osteochondral lesion with tricalcium phosphate grafting   2.  Right ankle arthroscopy with extensive debridement of the medial, lateral and anterior gutters   3.  AP and lateral xrays of the right ankle    SURGEON:  Wylene Simmer, MD  ASSISTANT: Mechele Claude, PA-C  ANESTHESIA:   General, regional  EBL:  minimal   TOURNIQUET:  40 min at 161 mm Hg  COMPLICATIONS:  None apparent  DISPOSITION:  Extubated, awake and stable to recovery.  INDICATION FOR PROCEDURE: The patient is a 67 year old male with a history of right ankle pain.  His x-rays and MRI show an osteochondral lesion of the medial talar dome.  He presents now for operative treatment having failed nonoperative treatment including activity modification, oral anti-inflammatories, bracing and therapy.  The risks and benefits of the alternative treatment options have been discussed in detail.  The patient wishes to proceed with surgery and specifically understands risks of bleeding, infection, nerve damage, blood clots, need for additional surgery, amputation and death.  PROCEDURE IN DETAIL:  After pre operative consent was obtained, and the correct operative site was identified, the patient was brought to the operating room and placed supine on the OR table.  Anesthesia was administered.  Pre-operative antibiotics were administered.  A surgical timeout was taken.  The right lower extremity was prepped and draped in standard sterile fashion with a tourniquet around the thigh.  The extremity was elevated and the tourniquet was inflated to 250 mmHg.  A  traction strap was then applied to the foot.  Gentle longitudinal strap distraction was applied.  An anteromedial arthroscopy portal was established under direct vision using the nick and spread technique.  The arthroscope was inserted into the ankle joint.  Immediately evident was significant anterior gutter synovitis that extended to the medial and lateral gutters.  An anterolateral arthroscopy portal was established again using the nick and spread technique.  A shaver was introduced into the ankle joint.  The shaver was used to resect the synovitis from the anterior, medial and lateral gutters.  This allowed adequate visualization of the remainder of the joint.  The syndesmosis was examined was noted to be stable.  The lateral gutter had no evidence of loose body.  The posterior gutter had no evidence of loose body.  The tibial plafond and talar dome had grade II chondromalacia.  The medial talar dome was noted to have an area of grade IV chondromalacia measuring 2 cm from anterior to posterior and 1 cm from medial to lateral.  All arthroscopic instruments were then removed.  A lateral fluoroscopic projection was obtained.  A stab incision was made at the sinus Tarsi.  The trocar for the [employer] sub-chondroplasty system was then introduced into the inferior aspect of the talus.  Under AP and lateral fluoroscopic guidance to the trocar was advanced into the cystic lesion at the medial talar dome.  Once appropriate position was obtained approximately 2 cc of tricalcium phosphate gel was injected.  This was allowed to harden appropriately after radiographs showed appropriate filling of  the defect.  Arthroscopic instruments were then put back in the joint and used to debride a small amount of tricalcium phosphate gel that had leaked into the joint.  The arthroscopic instruments were removed.  Once adequate setting time had elapsed the incisions were closed.  Sterile dressings were applied followed by a  well-padded cam boot.  The tourniquet was released after application of the dressings.  The patient was awakened from anesthesia and transported to the recovery room in stable condition.   FOLLOW UP PLAN: The patient will be nonweightbearing on the right lower extremity.  Follow-up in 2 weeks in the office.  Active range of motion until then.  Aspirin 81 mg p.o. twice daily for DVT prophylaxis.  We will plan to allow him to bear weight starting in 2 weeks in the cam boot.   RADIOGRAPHS: AP and lateral radiographs of the right ankle are obtained intraoperatively.  These show interval filling of the cystic osteochondral lesion at the medial talar dome.  No other acute injuries are noted.    Mechele Claude PA-C was present and scrubbed for the duration of the operative case. His assistance was essential in positioning the patient, prepping and draping, gaining and maintaining exposure, performing the operation, closing and dressing the wounds and applying the splint.

## 2017-08-24 ENCOUNTER — Encounter (HOSPITAL_BASED_OUTPATIENT_CLINIC_OR_DEPARTMENT_OTHER): Payer: Self-pay | Admitting: Orthopedic Surgery

## 2017-10-05 DIAGNOSIS — M25571 Pain in right ankle and joints of right foot: Secondary | ICD-10-CM | POA: Diagnosis not present

## 2017-10-05 DIAGNOSIS — Z4789 Encounter for other orthopedic aftercare: Secondary | ICD-10-CM | POA: Diagnosis not present

## 2017-10-05 DIAGNOSIS — M216X9 Other acquired deformities of unspecified foot: Secondary | ICD-10-CM | POA: Diagnosis not present

## 2018-03-12 DIAGNOSIS — Z23 Encounter for immunization: Secondary | ICD-10-CM | POA: Diagnosis not present

## 2018-04-03 DIAGNOSIS — K6289 Other specified diseases of anus and rectum: Secondary | ICD-10-CM | POA: Diagnosis not present

## 2018-04-03 DIAGNOSIS — Z86010 Personal history of colonic polyps: Secondary | ICD-10-CM | POA: Diagnosis not present

## 2018-04-22 IMAGING — US US AORTA SCREENING (MEDICARE)
1 series · 13 of 13 positions shown · non-contrast
Comparison: None.

CLINICAL DATA: [age] male with smoking history. First
time exam. Screening.

EXAM:
US ABDOMINAL AORTA MEDICARE SCREENING
TECHNIQUE: Ultrasound examination of the abdominal aorta was performed as a
screening evaluation for abdominal aortic aneurysm.

[Series 1: us aorta screening (medicare) · 0.35mm/px · 13 of 13 slices shown]
[im 1/13]
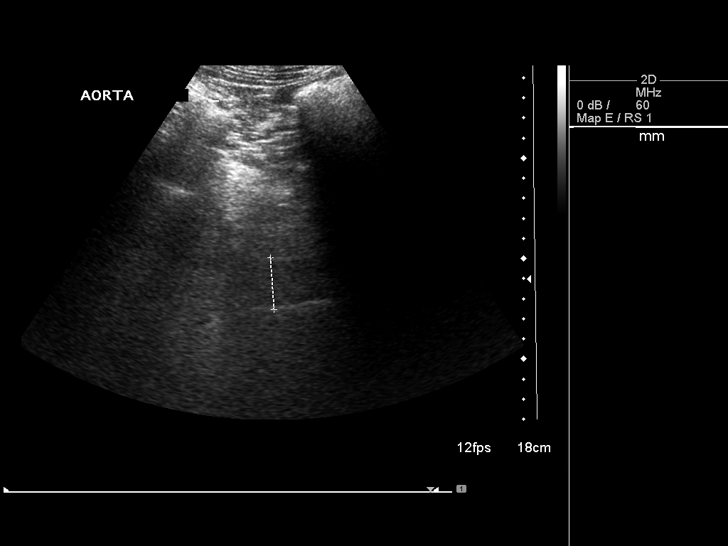
[im 2/13]
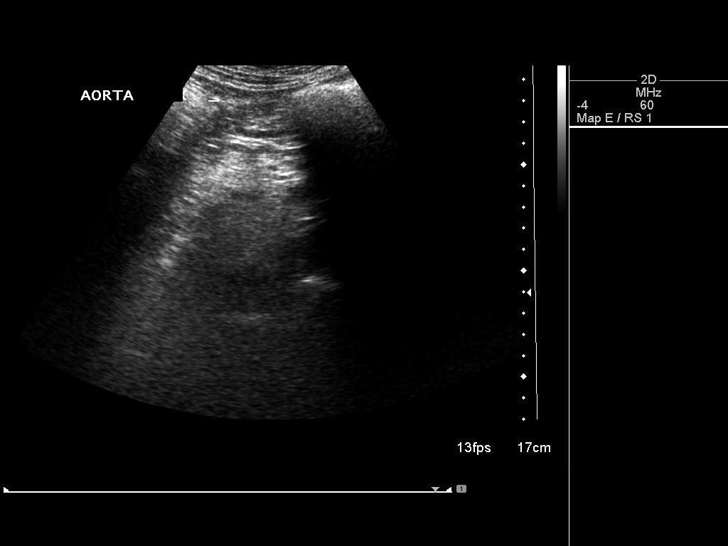
[im 3/13]
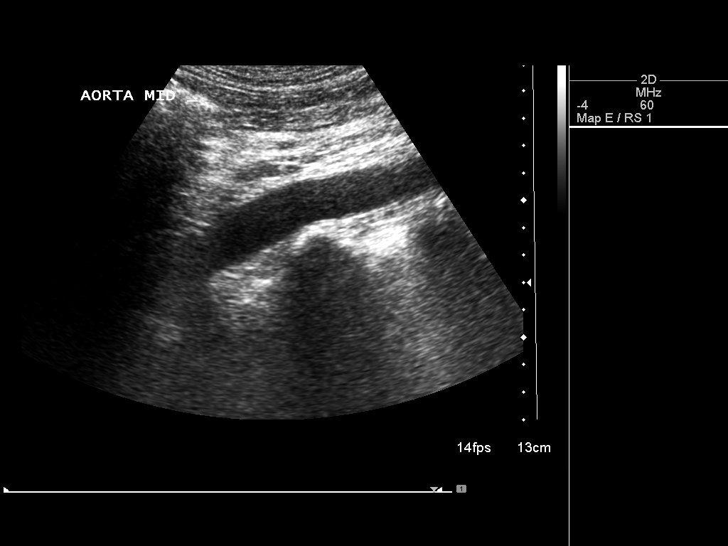
[im 4/13]
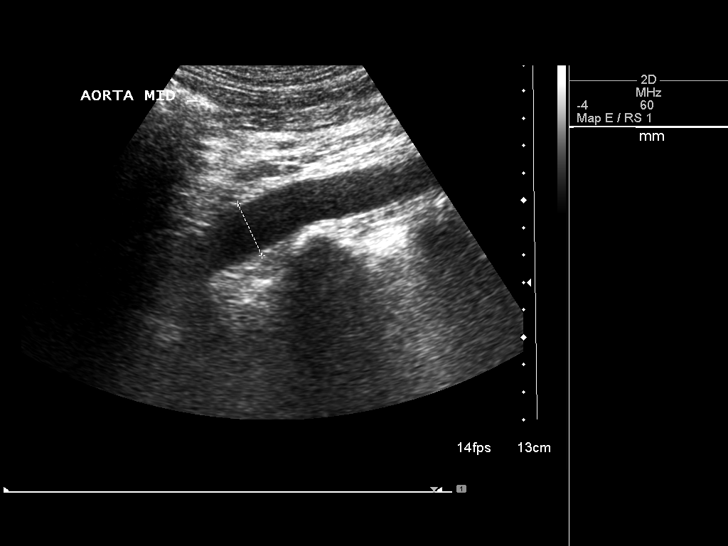
[im 5/13]
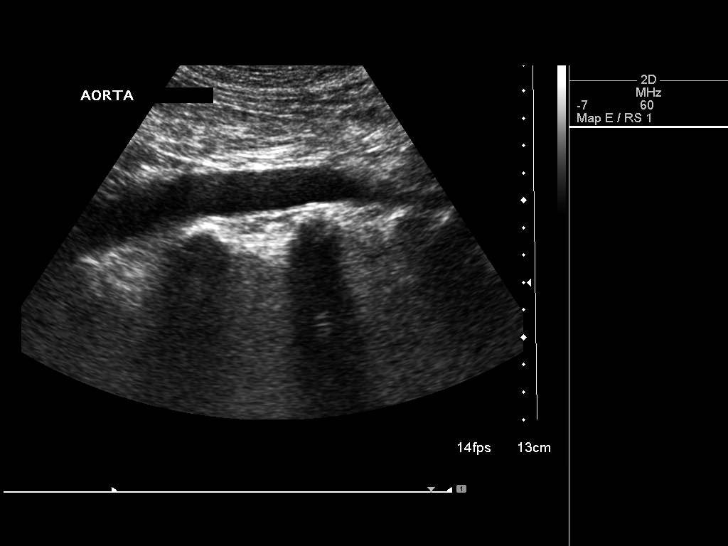
[im 6/13]
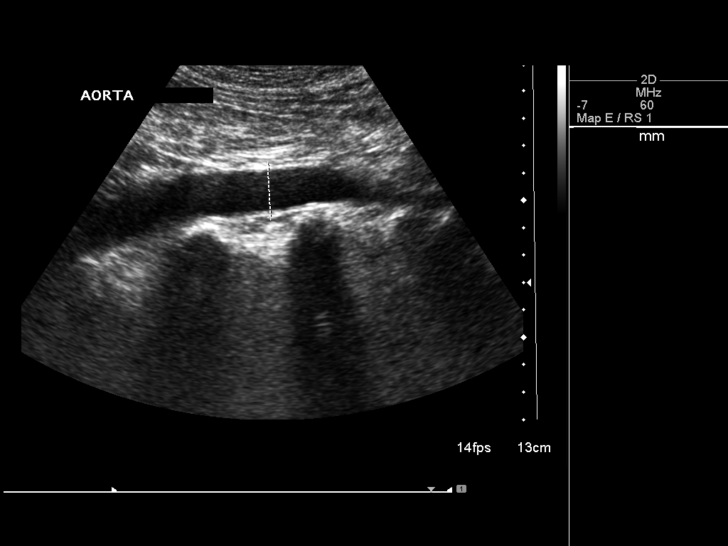
[im 7/13]
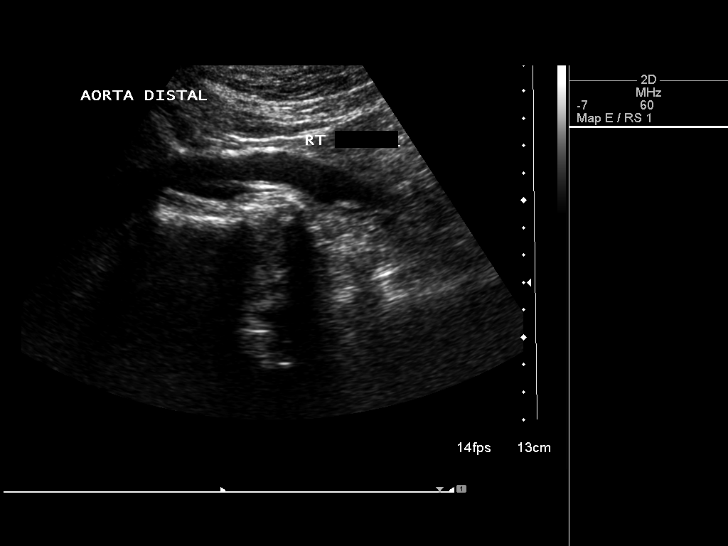
[im 8/13]
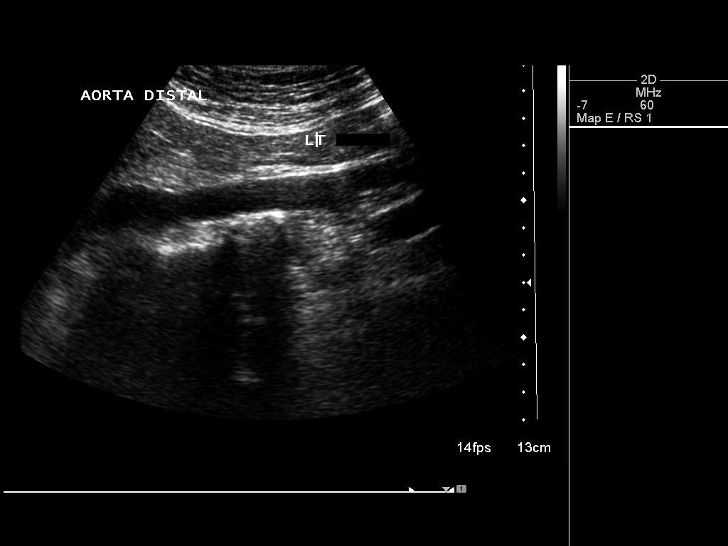
[im 9/13]
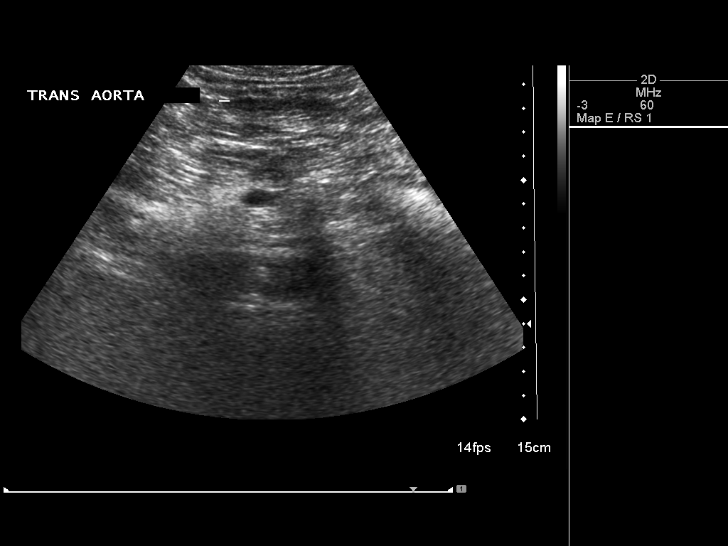
[im 10/13]
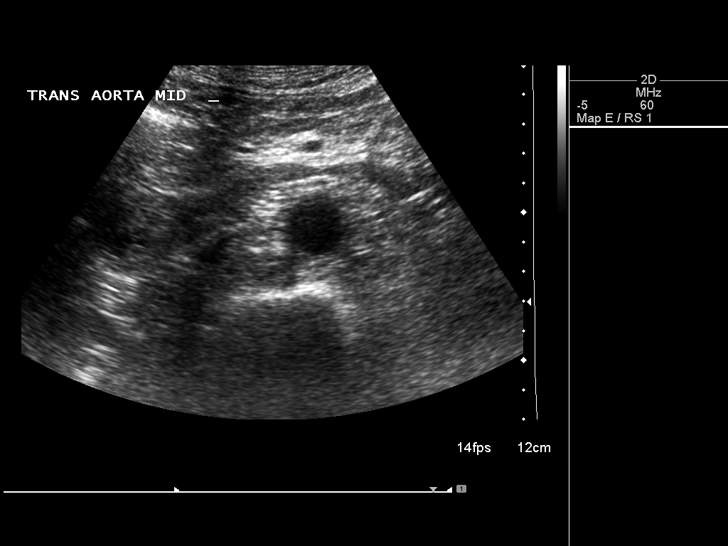
[im 11/13]
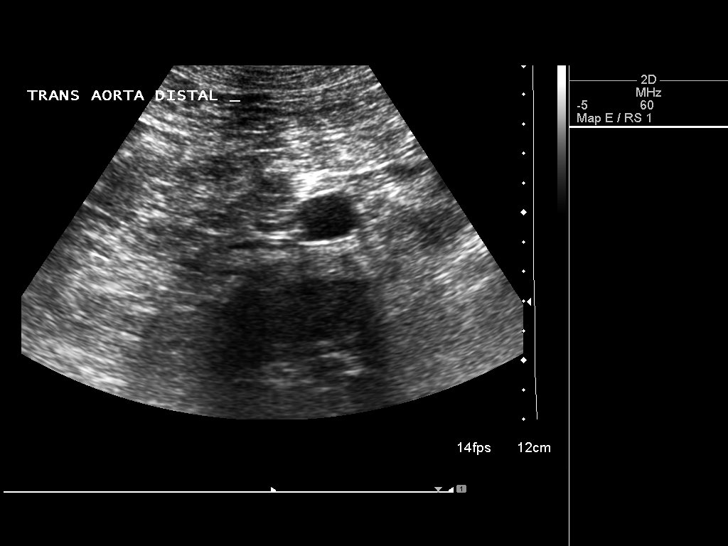
[im 12/13]
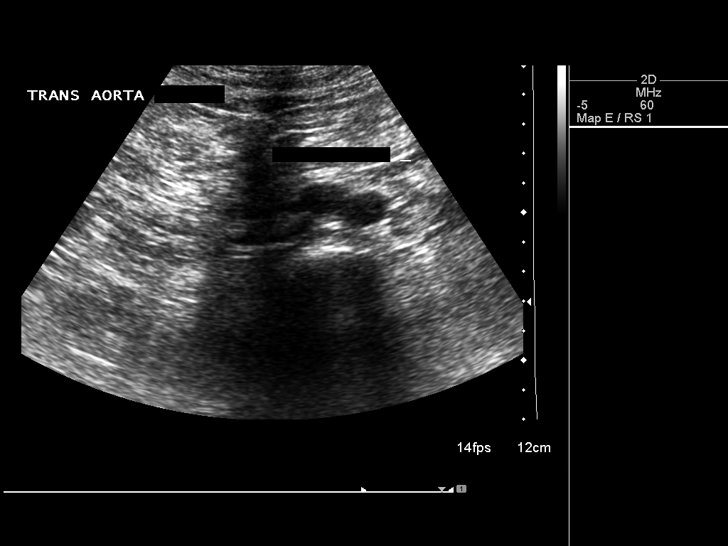
[im 13/13]
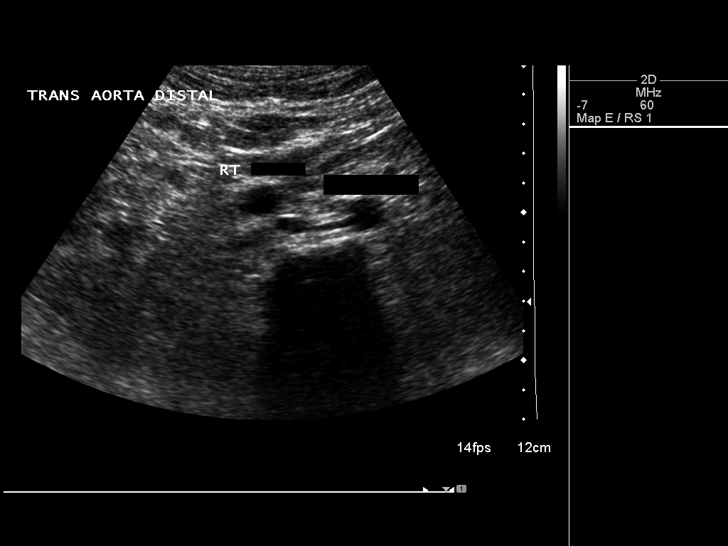

[13 of 13 positions shown; findings below may reference images not displayed]

FINDINGS: Maximum Diameter: 2.6 cm

ABDOMINAL AORTA

Proximal:  2.6 cm cm

Mid:  2.1 cm cm

Distal:  1.9 cm cm
IMPRESSION: No abdominal aortic aneurysm.

## 2018-04-25 DIAGNOSIS — K6289 Other specified diseases of anus and rectum: Secondary | ICD-10-CM | POA: Diagnosis not present

## 2018-04-25 DIAGNOSIS — Z86010 Personal history of colonic polyps: Secondary | ICD-10-CM | POA: Diagnosis not present

## 2018-04-25 DIAGNOSIS — K635 Polyp of colon: Secondary | ICD-10-CM | POA: Diagnosis not present

## 2018-04-25 DIAGNOSIS — K640 First degree hemorrhoids: Secondary | ICD-10-CM | POA: Diagnosis not present

## 2018-04-26 DIAGNOSIS — K635 Polyp of colon: Secondary | ICD-10-CM | POA: Diagnosis not present

## 2018-04-30 DIAGNOSIS — K635 Polyp of colon: Secondary | ICD-10-CM | POA: Diagnosis not present

## 2018-06-07 DIAGNOSIS — H903 Sensorineural hearing loss, bilateral: Secondary | ICD-10-CM | POA: Diagnosis not present

## 2018-06-07 DIAGNOSIS — H9113 Presbycusis, bilateral: Secondary | ICD-10-CM | POA: Diagnosis not present

## 2018-06-07 DIAGNOSIS — H9313 Tinnitus, bilateral: Secondary | ICD-10-CM | POA: Diagnosis not present

## 2018-06-12 DIAGNOSIS — L920 Granuloma annulare: Secondary | ICD-10-CM | POA: Diagnosis not present

## 2018-06-12 DIAGNOSIS — D229 Melanocytic nevi, unspecified: Secondary | ICD-10-CM | POA: Diagnosis not present

## 2018-06-12 DIAGNOSIS — L821 Other seborrheic keratosis: Secondary | ICD-10-CM | POA: Diagnosis not present

## 2018-06-12 DIAGNOSIS — L570 Actinic keratosis: Secondary | ICD-10-CM | POA: Diagnosis not present

## 2018-07-10 DIAGNOSIS — L570 Actinic keratosis: Secondary | ICD-10-CM | POA: Diagnosis not present

## 2018-12-13 DIAGNOSIS — R69 Illness, unspecified: Secondary | ICD-10-CM | POA: Diagnosis not present

## 2019-06-26 ENCOUNTER — Ambulatory Visit: Payer: Medicare HMO | Attending: Internal Medicine

## 2019-06-26 DIAGNOSIS — Z23 Encounter for immunization: Secondary | ICD-10-CM | POA: Insufficient documentation

## 2019-06-26 NOTE — Progress Notes (Signed)
   Covid-19 Vaccination Clinic  Name:  David Dillon    MRN: [id number] DOB: November 23, 1950  06/26/2019  [name] was observed post Covid-19 immunization for 15 minutes without incidence. He was provided with Vaccine Information Sheet and instruction to access the V-Safe system.   [name] was instructed to call 911 with any severe reactions post vaccine: Marland Kitchen Difficulty breathing  . Swelling of your face and throat  . A fast heartbeat  . A bad rash all over your body  . Dizziness and weakness    Immunizations Administered    Name Date Dose VIS Date Route   Pfizer COVID-19 Vaccine 06/26/2019  4:43 PM 0.3 mL 05/16/2019 Intramuscular   Manufacturer: [place]   Lot: [id number]   [place]: [id number]

## 2019-07-14 ENCOUNTER — Ambulatory Visit: Payer: Medicare HMO | Attending: Internal Medicine

## 2019-07-14 DIAGNOSIS — Z23 Encounter for immunization: Secondary | ICD-10-CM | POA: Insufficient documentation

## 2019-07-14 NOTE — Progress Notes (Signed)
   Covid-19 Vaccination Clinic  Name:  David Dillon    MRN: [id number] DOB: 1951/03/17  07/14/2019  [name] was observed post Covid-19 immunization for 15 minutes without incidence. He was provided with Vaccine Information Sheet and instruction to access the V-Safe system.   [name] was instructed to call 911 with any severe reactions post vaccine: Marland Kitchen Difficulty breathing  . Swelling of your face and throat  . A fast heartbeat  . A bad rash all over your body  . Dizziness and weakness    Immunizations Administered    Name Date Dose VIS Date Route   Pfizer COVID-19 Vaccine 07/14/2019  8:46 AM 0.3 mL 05/16/2019 Intramuscular   Manufacturer: [place]   Lot: [id number]   [place]: [id number]

## 2019-08-18 DIAGNOSIS — Z23 Encounter for immunization: Secondary | ICD-10-CM | POA: Diagnosis not present

## 2019-08-18 DIAGNOSIS — Z0000 Encounter for general adult medical examination without abnormal findings: Secondary | ICD-10-CM | POA: Diagnosis not present

## 2019-08-18 DIAGNOSIS — E786 Lipoprotein deficiency: Secondary | ICD-10-CM | POA: Diagnosis not present

## 2019-12-02 DIAGNOSIS — H524 Presbyopia: Secondary | ICD-10-CM | POA: Diagnosis not present

## 2019-12-02 DIAGNOSIS — Z0100 Encounter for examination of eyes and vision without abnormal findings: Secondary | ICD-10-CM | POA: Diagnosis not present

## 2020-08-20 DIAGNOSIS — Z0000 Encounter for general adult medical examination without abnormal findings: Secondary | ICD-10-CM | POA: Diagnosis not present

## 2020-08-20 DIAGNOSIS — E786 Lipoprotein deficiency: Secondary | ICD-10-CM | POA: Diagnosis not present

## 2021-01-14 DIAGNOSIS — Z20822 Contact with and (suspected) exposure to covid-19: Secondary | ICD-10-CM | POA: Diagnosis not present

## 2021-03-04 DIAGNOSIS — H524 Presbyopia: Secondary | ICD-10-CM | POA: Diagnosis not present

## 2021-08-24 DIAGNOSIS — B001 Herpesviral vesicular dermatitis: Secondary | ICD-10-CM | POA: Diagnosis not present

## 2021-08-24 DIAGNOSIS — Z0000 Encounter for general adult medical examination without abnormal findings: Secondary | ICD-10-CM | POA: Diagnosis not present

## 2021-08-24 DIAGNOSIS — E786 Lipoprotein deficiency: Secondary | ICD-10-CM | POA: Diagnosis not present

## 2021-09-28 DIAGNOSIS — H40013 Open angle with borderline findings, low risk, bilateral: Secondary | ICD-10-CM | POA: Diagnosis not present

## 2022-03-15 ENCOUNTER — Encounter (HOSPITAL_COMMUNITY): Payer: Self-pay

## 2022-03-15 ENCOUNTER — Emergency Department (HOSPITAL_COMMUNITY)
Admission: EM | Admit: 2022-03-15 | Discharge: 2022-03-15 | Disposition: A | Discharge status: Home / Self Care | Payer: Medicare HMO | Attending: Emergency Medicine | Admitting: Emergency Medicine

## 2022-03-15 ENCOUNTER — Ambulatory Visit
Admission: EM | Admit: 2022-03-15 | Discharge: 2022-03-15 | Disposition: A | Discharge status: Home / Self Care | Payer: Medicare HMO

## 2022-03-15 ENCOUNTER — Emergency Department (HOSPITAL_COMMUNITY): Payer: Medicare HMO

## 2022-03-15 DIAGNOSIS — R1084 Generalized abdominal pain: Secondary | ICD-10-CM | POA: Diagnosis present

## 2022-03-15 DIAGNOSIS — R109 Unspecified abdominal pain: Secondary | ICD-10-CM

## 2022-03-15 DIAGNOSIS — D72829 Elevated white blood cell count, unspecified: Secondary | ICD-10-CM | POA: Diagnosis not present

## 2022-03-15 DIAGNOSIS — R112 Nausea with vomiting, unspecified: Secondary | ICD-10-CM

## 2022-03-15 DIAGNOSIS — R1011 Right upper quadrant pain: Secondary | ICD-10-CM | POA: Insufficient documentation

## 2022-03-15 DIAGNOSIS — K760 Fatty (change of) liver, not elsewhere classified: Secondary | ICD-10-CM | POA: Diagnosis not present

## 2022-03-15 DIAGNOSIS — R1012 Left upper quadrant pain: Secondary | ICD-10-CM | POA: Diagnosis not present

## 2022-03-15 DIAGNOSIS — R197 Diarrhea, unspecified: Secondary | ICD-10-CM | POA: Diagnosis not present

## 2022-03-15 LAB — URINALYSIS, ROUTINE W REFLEX MICROSCOPIC
Bacteria, UA: NONE SEEN
Bilirubin Urine: NEGATIVE
Glucose, UA: NEGATIVE mg/dL
Ketones, ur: 5 mg/dL — AB
Leukocytes,Ua: NEGATIVE
Nitrite: NEGATIVE
Protein, ur: NEGATIVE mg/dL
Specific Gravity, Urine: 1.018 (ref 1.005–1.030)
pH: 5 (ref 5.0–8.0)

## 2022-03-15 LAB — LIPASE, BLOOD: Lipase: 27 U/L (ref 11–51)

## 2022-03-15 LAB — CBC WITH DIFFERENTIAL/PLATELET
Abs Immature Granulocytes: 0.04 10*3/uL (ref 0.00–0.07)
Basophils Absolute: 0.1 10*3/uL (ref 0.0–0.1)
Basophils Relative: 0 %
Eosinophils Absolute: 0 10*3/uL (ref 0.0–0.5)
Eosinophils Relative: 0 %
HCT: 49.4 % (ref 39.0–52.0)
Hemoglobin: 16.6 g/dL (ref 13.0–17.0)
Immature Granulocytes: 0 %
Lymphocytes Relative: 9 %
Lymphs Abs: 1.3 10*3/uL (ref 0.7–4.0)
MCH: 31.1 pg (ref 26.0–34.0)
MCHC: 33.6 g/dL (ref 30.0–36.0)
MCV: 92.5 fL (ref 80.0–100.0)
Monocytes Absolute: 1 10*3/uL (ref 0.1–1.0)
Monocytes Relative: 7 %
Neutro Abs: 12.2 10*3/uL — ABNORMAL HIGH (ref 1.7–7.7)
Neutrophils Relative %: 84 %
Platelets: 249 10*3/uL (ref 150–400)
RBC: 5.34 MIL/uL (ref 4.22–5.81)
RDW: 13 % (ref 11.5–15.5)
WBC: 14.6 10*3/uL — ABNORMAL HIGH (ref 4.0–10.5)
nRBC: 0 % (ref 0.0–0.2)

## 2022-03-15 LAB — COMPREHENSIVE METABOLIC PANEL
ALT: 32 U/L (ref 0–44)
AST: 28 U/L (ref 15–41)
Albumin: 4.6 g/dL (ref 3.5–5.0)
Alkaline Phosphatase: 48 U/L (ref 38–126)
Anion gap: 7 (ref 5–15)
BUN: 12 mg/dL (ref 8–23)
CO2: 25 mmol/L (ref 22–32)
Calcium: 9.3 mg/dL (ref 8.9–10.3)
Chloride: 102 mmol/L (ref 98–111)
Creatinine, Ser: 0.92 mg/dL (ref 0.61–1.24)
GFR, Estimated: >60 mL/min (ref >60)
Glucose, Bld: 118 mg/dL — ABNORMAL HIGH (ref 70–99)
Potassium: 4.3 mmol/L (ref 3.5–5.1)
Sodium: 134 mmol/L — ABNORMAL LOW (ref 135–145)
Total Bilirubin: 1 mg/dL (ref 0.3–1.2)
Total Protein: 8.2 g/dL — ABNORMAL HIGH (ref 6.5–8.1)

## 2022-03-15 MED ORDER — ALUM & MAG HYDROXIDE-SIMETH 200-200-20 MG/5ML PO SUSP
30.0000 mL | Freq: Once | ORAL | Status: AC
  Administered 2022-03-15: 30 mL via ORAL
  Filled 2022-03-15: qty 30

## 2022-03-15 MED ORDER — SUCRALFATE 1 G PO TABS
1.0000 g | ORAL_TABLET | Freq: Once | ORAL | Status: AC
  Administered 2022-03-15: 1 g via ORAL
  Filled 2022-03-15: qty 1

## 2022-03-15 MED ORDER — PANTOPRAZOLE SODIUM 20 MG PO TBEC: 20.0000 mg | DELAYED_RELEASE_TABLET | Freq: Two times a day (BID) | ORAL | 0 refills | Status: DC

## 2022-03-15 MED ORDER — FAMOTIDINE 20 MG PO TABS
40.0000 mg | ORAL_TABLET | Freq: Once | ORAL | Status: AC
  Administered 2022-03-15: 40 mg via ORAL
  Filled 2022-03-15: qty 2

## 2022-03-15 MED ORDER — SODIUM CHLORIDE (PF) 0.9 % IJ SOLN
INTRAMUSCULAR | Status: AC
  Filled 2022-03-15: qty 50

## 2022-03-15 MED ORDER — HYOSCYAMINE SULFATE 0.125 MG SL SUBL
0.2500 mg | SUBLINGUAL_TABLET | Freq: Once | SUBLINGUAL | Status: AC
  Administered 2022-03-15: 0.25 mg via SUBLINGUAL
  Filled 2022-03-15: qty 2

## 2022-03-15 MED ORDER — DICYCLOMINE HCL 20 MG PO TABS: 20.0000 mg | ORAL_TABLET | Freq: Two times a day (BID) | ORAL | 0 refills | Status: DC

## 2022-03-15 MED ORDER — ONDANSETRON HCL 4 MG/2ML IJ SOLN: 4.0000 mg | Freq: Once | INTRAMUSCULAR | Status: DC

## 2022-03-15 MED ORDER — LACTATED RINGERS IV SOLN: INTRAVENOUS | Status: DC

## 2022-03-15 MED ORDER — OXYCODONE-ACETAMINOPHEN 5-325 MG PO TABS: 1.0000 | ORAL_TABLET | Freq: Four times a day (QID) | ORAL | 0 refills | Status: DC | PRN

## 2022-03-15 MED ORDER — SUCRALFATE 1 G PO TABS: 1.0000 g | ORAL_TABLET | Freq: Four times a day (QID) | ORAL | 0 refills | Status: DC

## 2022-03-15 MED ORDER — ONDANSETRON 8 MG PO TBDP
8.0000 mg | ORAL_TABLET | Freq: Once | ORAL | Status: AC
  Administered 2022-03-15: 8 mg via ORAL
  Filled 2022-03-15: qty 1

## 2022-03-15 MED ORDER — IOHEXOL 300 MG/ML  SOLN
100.0000 mL | Freq: Once | INTRAMUSCULAR | Status: AC | PRN
  Administered 2022-03-15: 100 mL via INTRAVENOUS

## 2022-03-15 MED ORDER — LACTATED RINGERS IV BOLUS: 1000.0000 mL | Freq: Once | INTRAVENOUS | Status: DC

## 2022-03-15 NOTE — ED Notes (Signed)
Patient is being discharged from the Urgent Care and sent to the Emergency Department via self . Per Hildred Alamin, patient is in need of higher level of care due to abd pain. Patient is aware and verbalizes understanding of plan of care.  Vitals:   03/15/22 0823  BP: (!) 148/85  Pulse: 84  Resp: 18  Temp: 98 F (36.7 C)  SpO2: 97%

## 2022-03-15 NOTE — ED Triage Notes (Signed)
Pt c/o abd cramps onset ~ last night ~ 7pm. Associated nausea, vomiting, diarrhea.

## 2022-03-15 NOTE — Discharge Instructions (Signed)
Use the oxycodone only if the other medications do not work and take them until your pain is improved.  Return here if you begin to have uncontrolled vomiting

## 2022-03-15 NOTE — ED Triage Notes (Signed)
Pt arrived via POV, c/o diffuse abd pain, n/v, small amts of diarrhea, denies any urinary issues. Sx started last night

## 2022-03-15 NOTE — ED Provider Notes (Signed)
[facility] DEPT Provider Note   CSN: [id number] Arrival date & time: 03/15/22  0957     History  Chief Complaint  Patient presents with   Abdominal Pain    David Dillon is a 71 y.o. male.  71 year old male presents with diffuse abdominal pain mostly left upper quadrant.  Began yesterday evening.  Thought maybe had something bad to eat.  Had emesis x2 that was self-induced.  Some watery diarrhea.  No fever or chills.  No prior history of same.  No treatment use prior to arrival       Home Medications Prior to Admission medications   Medication Sig Start Date End Date Taking? Authorizing Provider  cetirizine (ZYRTEC) 10 MG tablet Take 10 mg by mouth daily as needed for allergies.    [provider]  docusate sodium (COLACE) 100 MG capsule Take 1 capsule (100 mg total) by mouth 2 (two) times daily. While taking narcotic pain medicine. 08/23/17   Corky Sing, PA-C  fluticasone [facility]) 50 MCG/ACT nasal spray Place 2 sprays into both nostrils daily as needed for allergies or rhinitis.    [provider]  naproxen sodium (ALEVE) 220 MG tablet Take 220 mg by mouth.    [provider]  oxyCODONE (ROXICODONE) 5 MG immediate release tablet Take 1 tablet (5 mg total) by mouth every 4 (four) hours as needed for moderate pain or severe pain. For no more than 5 days. 08/23/17   Corky Sing, PA-C  ranitidine (ZANTAC) 150 MG capsule Take 150 mg by mouth daily as needed for heartburn.    [provider]  senna (SENOKOT) 8.6 MG TABS tablet Take 2 tablets (17.2 mg total) by mouth 2 (two) times daily. 08/23/17   Corky Sing, PA-C      Allergies    Patient has no known allergies.    Review of Systems   Review of Systems  All other systems reviewed and are negative.   Physical Exam Updated Vital Signs BP (!) 152/78   Pulse 70   Temp 98 F (36.7 C) (Oral)   Resp 16   SpO2 96%  Physical Exam Vitals  and nursing note reviewed.  Constitutional:      General: He is not in acute distress.    Appearance: Normal appearance. He is well-developed. He is not toxic-appearing.  HENT:     Head: Normocephalic and atraumatic.  Eyes:     General: Lids are normal.     Conjunctiva/sclera: Conjunctivae normal.     Pupils: Pupils are equal, round, and reactive to light.  Neck:     Thyroid: No thyroid mass.     Trachea: No tracheal deviation.  Cardiovascular:     Rate and Rhythm: Normal rate and regular rhythm.     Heart sounds: Normal heart sounds. No murmur heard.    No gallop.  Pulmonary:     Effort: Pulmonary effort is normal. No respiratory distress.     Breath sounds: Normal breath sounds. No stridor. No decreased breath sounds, wheezing, rhonchi or rales.  Abdominal:     General: There is no distension.     Palpations: Abdomen is soft.     Tenderness: There is abdominal tenderness in the right upper quadrant. There is guarding. There is no rebound.    Musculoskeletal:        General: No tenderness. Normal range of motion.     Cervical back: Normal range of motion and neck supple.  Skin:    General: Skin is warm and dry.     Findings: No abrasion or rash.  Neurological:     Mental Status: He is alert and oriented to person, place, and time. Mental status is at baseline.     GCS: GCS eye subscore is 4. GCS verbal subscore is 5. GCS motor subscore is 6.     Cranial Nerves: No cranial nerve deficit.     Sensory: No sensory deficit.     Motor: Motor function is intact.  Psychiatric:        Attention and Perception: Attention normal.        Speech: Speech normal.        Behavior: Behavior normal.     ED Results / Procedures / Treatments   Labs (all labs ordered are listed, but only abnormal results are displayed) Labs Reviewed  CBC WITH DIFFERENTIAL/PLATELET  LIPASE, BLOOD  COMPREHENSIVE METABOLIC PANEL  URINALYSIS, ROUTINE W REFLEX MICROSCOPIC    EKG None  Radiology No  results found.  Procedures Procedures    Medications Ordered in ED Medications  lactated ringers bolus 1,000 mL (has no administration in time range)  lactated ringers infusion (has no administration in time range)  ondansetron (ZOFRAN) injection 4 mg (has no administration in time range)    ED Course/ Medical Decision Making/ A&P                           Medical Decision Making Amount and/or Complexity of Data Reviewed Labs: ordered. Radiology: ordered.  Risk OTC drugs. Prescription drug management.   Patient presented with abdominal discomfort concerning for possible obstruction versus gastroenteritis.  Mild leukocytosis noted on his CBC.  Concern for possible intra-abdominal infection.  The patient subsequently had abdominal CT which per my interpretation showed no acute findings.  That the patient has some component of gastroenteritis and will treat with medications here.  Do not think he has a urinary process.  Will discharge home with follow-up with his doctor        Final Clinical Impression(s) / ED Diagnoses Final diagnoses:  None    Rx / DC Orders ED Discharge Orders     None         Lacretia Leigh, MD 03/15/22 1310

## 2022-03-15 NOTE — ED Provider Notes (Signed)
[facility]    CSN: [id number] Arrival date & time: 03/15/22  6629      History   Chief Complaint Chief Complaint  Patient presents with   Abdominal Pain    HPI David Dillon is a 71 y.o. male.   Patient presents with abdominal pain, nausea, vomiting, diarrhea that started around 7 PM last night.  Patient reports abdominal pain is a constant, cramping pain that is rated 8/10 on pain scale.  Patient denies any blood in stool or emesis.  Denies any associated fever but reports that he has "felt feverish" and had chills.  Denies any known sick contacts.  Patient reports that he did eat some eggs yesterday morning but is not sure if this is contributing to symptoms.  Denies any associated upper respiratory symptoms or cough.  Patient denies that he has any chronic gastrointestinal issues.  Patient reports that he has been able to keep water down.   Abdominal Pain   Past Medical History:  Diagnosis Date   DDD (degenerative disc disease), lumbosacral    Dental bridge present    lower   Environmental allergies    pollen, grass, trees   GERD (gastroesophageal reflux disease)    History of asthma    > 30 years ago   Immature cataract of both eyes    OA (osteoarthritis)    bilateral hand   Osteochondral lesion of talar dome 08/2017   right   Sinus headache    Urinary frequency     Patient Active Problem List   Diagnosis Date Noted   Right ankle pain 05/02/2016    Past Surgical History:  Procedure Laterality Date   ANKLE ARTHROSCOPY WITH DRILLING/MICROFRACTURE Right 08/23/2017   Procedure: Ankle Arthroscopy with Extensive Debridement and Grafting of the Osteochondral Lesions of Talus;  Surgeon: Wylene Simmer, MD;  Location: [facility];  Service: Orthopedics;  Laterality: Right;   COLONOSCOPY  03/2002   DENTAL SURGERY     VASECTOMY         Home Medications    Prior to Admission medications   Medication Sig Start Date End Date Taking?  Authorizing Provider  cetirizine (ZYRTEC) 10 MG tablet Take 10 mg by mouth daily as needed for allergies.    [provider]  docusate sodium (COLACE) 100 MG capsule Take 1 capsule (100 mg total) by mouth 2 (two) times daily. While taking narcotic pain medicine. 08/23/17   Corky Sing, PA-C  fluticasone [facility]) 50 MCG/ACT nasal spray Place 2 sprays into both nostrils daily as needed for allergies or rhinitis.    [provider]  naproxen sodium (ALEVE) 220 MG tablet Take 220 mg by mouth.    [provider]  oxyCODONE (ROXICODONE) 5 MG immediate release tablet Take 1 tablet (5 mg total) by mouth every 4 (four) hours as needed for moderate pain or severe pain. For no more than 5 days. 08/23/17   Corky Sing, PA-C  ranitidine (ZANTAC) 150 MG capsule Take 150 mg by mouth daily as needed for heartburn.    [provider]  senna (SENOKOT) 8.6 MG TABS tablet Take 2 tablets (17.2 mg total) by mouth 2 (two) times daily. 08/23/17   Corky Sing, PA-C    Family History Family History  Problem Relation Age of Onset   Diabetes Mother    Breast cancer Mother    Heart attack Father 34   Breast cancer Sister    CVA Maternal Grandmother 84  Colon cancer Maternal Uncle    Melanoma Daughter     Social History Social History   Tobacco Use   Smoking status: Former    Types: Cigarettes    Quit date: 06/05/1983    Years since quitting: 38.8   Smokeless tobacco: Never  Vaping Use   Vaping Use: Never used  Substance Use Topics   Alcohol use: Yes    Comment: 1-2 beers/day   Drug use: No     Allergies   Patient has no known allergies.   Review of Systems Review of Systems Per HPI  Physical Exam Triage Vital Signs ED Triage Vitals  Enc Vitals Group     BP 03/15/22 0823 (!) 148/85     Pulse Rate 03/15/22 0823 84     Resp 03/15/22 0823 18     Temp 03/15/22 0823 98 F (36.7 C)     Temp Source 03/15/22 0823 Oral     SpO2 03/15/22 0823  97 %     Weight --      Height --      Head Circumference --      Peak Flow --      Pain Score 03/15/22 0824 7     Pain Loc --      Pain Edu? --      Excl. in [place]? --    No data found.  Updated Vital Signs BP (!) 148/85 (BP Location: Left Arm)   Pulse 84   Temp 98 F (36.7 C) (Oral)   Resp 18   SpO2 97%   Visual Acuity Right Eye Distance:   Left Eye Distance:   Bilateral Distance:    Right Eye Near:   Left Eye Near:    Bilateral Near:     Physical Exam Constitutional:      General: He is not in acute distress.    Appearance: Normal appearance. He is not toxic-appearing or diaphoretic.  HENT:     Head: Normocephalic and atraumatic.     Mouth/Throat:     Mouth: Mucous membranes are moist.     Pharynx: No posterior oropharyngeal erythema.  Eyes:     Extraocular Movements: Extraocular movements intact.     Conjunctiva/sclera: Conjunctivae normal.  Cardiovascular:     Rate and Rhythm: Normal rate and regular rhythm.     Pulses: Normal pulses.     Heart sounds: Normal heart sounds.  Pulmonary:     Effort: Pulmonary effort is normal. No respiratory distress.     Breath sounds: Normal breath sounds.  Abdominal:     General: Bowel sounds are normal. There is no distension.     Palpations: Abdomen is soft.     Tenderness: There is abdominal tenderness in the right upper quadrant and epigastric area.     Comments: Patient is significantly tender to palpation to right upper quadrant and midepigastric area.  Neurological:     General: No focal deficit present.     Mental Status: He is alert and oriented to person, place, and time. Mental status is at baseline.  Psychiatric:        Mood and Affect: Mood normal.        Behavior: Behavior normal.        Thought Content: Thought content normal.        Judgment: Judgment normal.      UC Treatments / Results  Labs (all labs ordered are listed, but only abnormal results are displayed) Labs Reviewed - No data to  display  EKG   Radiology No results found.  Procedures Procedures (including critical care time)  Medications Ordered in UC Medications - No data to display  Initial Impression / Assessment and Plan / UC Course  I have reviewed the triage vital signs and the nursing notes.  Pertinent labs & imaging results that were available during my care of the patient were reviewed by me and considered in my medical decision making (see chart for details).     Given severity of abdominal pain and patient's history and on physical exam, I do think this warrants imaging of the abdomen for further evaluation.  Can not provide advanced imaging here in urgent care so patient was advised to go to the emergency department for further evaluation and management.  Patient was agreeable with plan.  Vital signs and patient stable at discharge.  Agree with patient self transport to the hospital. Final Clinical Impressions(s) / UC Diagnoses   Final diagnoses:  Sudden onset of severe abdominal pain  Nausea vomiting and diarrhea     Discharge Instructions      Please go to the emergency department as soon as you leave urgent care for further evaluation and management.    ED Prescriptions   None    PDMP not reviewed this encounter.   Teodora Medici, [place] 03/15/22 [phone]

## 2022-03-15 NOTE — Discharge Instructions (Signed)
Please go to the emergency department as soon as you leave urgent care for further evaluation and management. ?

## 2022-03-29 DIAGNOSIS — R197 Diarrhea, unspecified: Secondary | ICD-10-CM | POA: Diagnosis not present

## 2022-03-29 DIAGNOSIS — R3129 Other microscopic hematuria: Secondary | ICD-10-CM | POA: Diagnosis not present

## 2022-04-26 ENCOUNTER — Encounter (HOSPITAL_BASED_OUTPATIENT_CLINIC_OR_DEPARTMENT_OTHER): Payer: Self-pay | Admitting: Emergency Medicine

## 2022-04-26 ENCOUNTER — Other Ambulatory Visit: Payer: Self-pay

## 2022-04-26 ENCOUNTER — Emergency Department (HOSPITAL_BASED_OUTPATIENT_CLINIC_OR_DEPARTMENT_OTHER)
Admission: EM | Admit: 2022-04-26 | Discharge: 2022-04-26 | Disposition: A | Discharge status: Home / Self Care | Payer: Medicare HMO | Attending: Emergency Medicine | Admitting: Emergency Medicine

## 2022-04-26 ENCOUNTER — Ambulatory Visit
Admission: EM | Admit: 2022-04-26 | Discharge: 2022-04-26 | Disposition: A | Discharge status: Home / Self Care | Payer: Medicare HMO

## 2022-04-26 DIAGNOSIS — S6992XA Unspecified injury of left wrist, hand and finger(s), initial encounter: Secondary | ICD-10-CM | POA: Diagnosis not present

## 2022-04-26 DIAGNOSIS — S61311A Laceration without foreign body of left index finger with damage to nail, initial encounter: Secondary | ICD-10-CM

## 2022-04-26 DIAGNOSIS — W260XXA Contact with knife, initial encounter: Secondary | ICD-10-CM | POA: Insufficient documentation

## 2022-04-26 NOTE — Discharge Instructions (Signed)
I recommend that you leave the dressing on your finger in place for the next 24-48 hours. Please keep the wound clean and dry until there is a scab in place. Continue to redress the wound with the materials provided for you to today. Follow-up with your primary care doctor for a wound check in the next few days.  Return if development of any new or worsening symptoms

## 2022-04-26 NOTE — Discharge Instructions (Signed)
Go to the emergency department as soon as you leave urgent care for further evaluation and management. 

## 2022-04-26 NOTE — ED Provider Notes (Signed)
[facility]    CSN: [id number] Arrival date & time: 04/26/22  1110      History   Chief Complaint Chief Complaint  Patient presents with   left finger cut    HPI David Dillon is a 71 y.o. male.   Patient presents with a laceration to left second digit at the distal end that occurred yesterday around 6:30 PM.  Patient reports that he was using a chef's knife cutting scallions when he accidentally hit his finger.  Tetanus vaccine is up-to-date within 5 years per patient.  Denies numbness or tingling.  Has full range of motion of finger. Does not take any blood thinning medications.      Past Medical History:  Diagnosis Date   DDD (degenerative disc disease), lumbosacral    Dental bridge present    lower   Environmental allergies    pollen, grass, trees   GERD (gastroesophageal reflux disease)    History of asthma    > 30 years ago   Immature cataract of both eyes    OA (osteoarthritis)    bilateral hand   Osteochondral lesion of talar dome 08/2017   right   Sinus headache    Urinary frequency     Patient Active Problem List   Diagnosis Date Noted   Right ankle pain 05/02/2016    Past Surgical History:  Procedure Laterality Date   ANKLE ARTHROSCOPY WITH DRILLING/MICROFRACTURE Right 08/23/2017   Procedure: Ankle Arthroscopy with Extensive Debridement and Grafting of the Osteochondral Lesions of Talus;  Surgeon: Wylene Simmer, MD;  Location: [facility];  Service: Orthopedics;  Laterality: Right;   COLONOSCOPY  03/2002   DENTAL SURGERY     VASECTOMY         Home Medications    Prior to Admission medications   Medication Sig Start Date End Date Taking? Authorizing Provider  cetirizine (ZYRTEC) 10 MG tablet Take 10 mg by mouth daily as needed for allergies.    [provider]  dicyclomine (BENTYL) 20 MG tablet Take 1 tablet (20 mg total) by mouth 2 (two) times daily. 03/15/22   Lacretia Leigh, MD  docusate sodium  (COLACE) 100 MG capsule Take 1 capsule (100 mg total) by mouth 2 (two) times daily. While taking narcotic pain medicine. 08/23/17   Corky Sing, PA-C  fluticasone [facility]) 50 MCG/ACT nasal spray Place 2 sprays into both nostrils daily as needed for allergies or rhinitis.    [provider]  naproxen sodium (ALEVE) 220 MG tablet Take 220 mg by mouth.    [provider]  oxyCODONE (ROXICODONE) 5 MG immediate release tablet Take 1 tablet (5 mg total) by mouth every 4 (four) hours as needed for moderate pain or severe pain. For no more than 5 days. 08/23/17   Corky Sing, PA-C  oxyCODONE-acetaminophen (PERCOCET/ROXICET) 5-325 MG tablet Take 1 tablet by mouth every 6 (six) hours as needed for severe pain. 03/15/22   Lacretia Leigh, MD  pantoprazole (PROTONIX) 20 MG tablet Take 1 tablet (20 mg total) by mouth 2 (two) times daily before a meal. 03/15/22   Lacretia Leigh, MD  ranitidine (ZANTAC) 150 MG capsule Take 150 mg by mouth daily as needed for heartburn.    [provider]  senna (SENOKOT) 8.6 MG TABS tablet Take 2 tablets (17.2 mg total) by mouth 2 (two) times daily. 08/23/17   Corky Sing, PA-C  sucralfate (CARAFATE) 1 g tablet Take 1 tablet (1 g total) by  mouth 4 (four) times daily. 03/15/22   Lacretia Leigh, MD    Family History Family History  Problem Relation Age of Onset   Diabetes Mother    Breast cancer Mother    Heart attack Father 50   Breast cancer Sister    CVA Maternal Grandmother 43   Colon cancer Maternal Uncle    Melanoma Daughter     Social History Social History   Tobacco Use   Smoking status: Former    Types: Cigarettes    Quit date: 06/05/1983    Years since quitting: 38.9   Smokeless tobacco: Never  Vaping Use   Vaping Use: Never used  Substance Use Topics   Alcohol use: Yes    Comment: 1-2 beers/day   Drug use: No     Allergies   Patient has no known allergies.   Review of Systems Review of Systems Per  HPI  Physical Exam Triage Vital Signs ED Triage Vitals [04/26/22 1159]  Enc Vitals Group     BP (!) 155/80     Pulse Rate 70     Resp 16     Temp 98 F (36.7 C)     Temp Source Oral     SpO2 97 %     Weight      Height      Head Circumference      Peak Flow      Pain Score 2     Pain Loc      Pain Edu?      Excl. in [place]?    No data found.  Updated Vital Signs BP (!) 155/80 (BP Location: Left Arm)   Pulse 70   Temp 98 F (36.7 C) (Oral)   Resp 16   SpO2 97%   Visual Acuity Right Eye Distance:   Left Eye Distance:   Bilateral Distance:    Right Eye Near:   Left Eye Near:    Bilateral Near:     Physical Exam Constitutional:      General: He is not in acute distress.    Appearance: Normal appearance. He is not toxic-appearing or diaphoretic.  HENT:     Head: Normocephalic and atraumatic.  Eyes:     Extraocular Movements: Extraocular movements intact.     Conjunctiva/sclera: Conjunctivae normal.  Pulmonary:     Effort: Pulmonary effort is normal.  Skin:    Comments: Superficial avulsion laceration present to medial distal end of left second digit.  Partial nail is removed as well.  Capillary refill and pulses intact.  Patient has full range of motion of finger.  Neurological:     General: No focal deficit present.     Mental Status: He is alert and oriented to person, place, and time. Mental status is at baseline.  Psychiatric:        Mood and Affect: Mood normal.        Behavior: Behavior normal.        Thought Content: Thought content normal.        Judgment: Judgment normal.      UC Treatments / Results  Labs (all labs ordered are listed, but only abnormal results are displayed) Labs Reviewed - No data to display  EKG   Radiology No results found.  Procedures Procedures (including critical care time)  Medications Ordered in UC Medications - No data to display  Initial Impression / Assessment and Plan / UC Course  I have reviewed the  triage vital signs and  the nursing notes.  Pertinent labs & imaging results that were available during my care of the patient were reviewed by me and considered in my medical decision making (see chart for details).     No sutures needed given that it is an avulsion laceration and top portion of skin was removed with partial fingernail.  It is currently bleeding which is main complication.  Patient does not take any blood thinning medications.  Pressure dressing was applied.  Bleeding was still present after first pressure dressing.  Discussed with patient that we can continue to apply pressure and reassess.  Also discussed with patient that if were not able to get bleeding to stop, then he will need to go to the ER for further evaluation given that they have more resources than urgent care.  Patient then stated while pressure was being applied for the second time that he does not wish to wait in urgent care anymore and wishes to go to the ER now for further evaluation.  He wishes to defer any further evaluation/treatment here in urgent care.  Dressing was applied for patient prior to discharge to control bleeding on way to ER.  Advised patient that he will need to remove this dressing in about 25 minutes and simply apply pressure with regular gauze if still bleeding.  Patient voiced understanding.  Patient left via self transport to go to the ER. Final Clinical Impressions(s) / UC Diagnoses   Final diagnoses:  Laceration of left index finger without foreign body with damage to nail, initial encounter     Discharge Instructions      Go to the emergency department as soon as you leave urgent care for further evaluation and management.     ED Prescriptions   None    PDMP not reviewed this encounter.   Teodora Medici, [place] 04/26/22 1332

## 2022-04-26 NOTE — ED Notes (Signed)
Patient is being discharged from the Urgent Care and sent to the Emergency Department via self . Per Hildred Alamin, patient is in need of higher level of care due to bleeding. Patient is aware and verbalizes understanding of plan of care.  Vitals:   04/26/22 1159  BP: (!) 155/80  Pulse: 70  Resp: 16  Temp: 98 F (36.7 C)  SpO2: 97%

## 2022-04-26 NOTE — ED Triage Notes (Signed)
Sent from UC due to laceration to L index finger that occurred last. States it is still bleeding. Bandage in place.

## 2022-04-26 NOTE — ED Provider Notes (Signed)
[facility]ERGENC[facility] DEPT Provider Note   CSN: [id number] Arrival date & time: 04/26/22  1401     History  Chief Complaint  Patient presents with   Finger Injury    David Dillon is a 71 y.o. male.  Patient with no pertinent past medical history presents today with complaints of finger injury. He states that same occurred yesterday when he was chopping scallions around 6:30 PM. States that he was using a chef's knife and cut the tip of his left index finger. Tdap up to date. Denies any limited range of motion, numbness, or tingling. He is not anticoagulated. States that he dressed the wound overnight but when he took the dressing off this morning he was unable to get the bleeding under control and went to urgent care for same. He states that urgent care tried direct pressure but were also unable to get the bleeding under control and sent him here.  The history is provided by the patient. No language interpreter was used.       Home Medications Prior to Admission medications   Medication Sig Start Date End Date Taking? Authorizing Provider  cetirizine (ZYRTEC) 10 MG tablet Take 10 mg by mouth daily as needed for allergies.    [provider]  dicyclomine (BENTYL) 20 MG tablet Take 1 tablet (20 mg total) by mouth 2 (two) times daily. 03/15/22   Lacretia Leigh, MD  docusate sodium (COLACE) 100 MG capsule Take 1 capsule (100 mg total) by mouth 2 (two) times daily. While taking narcotic pain medicine. 08/23/17   Corky Sing, PA-C  fluticasone [facility]) 50 MCG/ACT nasal spray Place 2 sprays into both nostrils daily as needed for allergies or rhinitis.    [provider]  naproxen sodium (ALEVE) 220 MG tablet Take 220 mg by mouth.    [provider]  oxyCODONE (ROXICODONE) 5 MG immediate release tablet Take 1 tablet (5 mg total) by mouth every 4 (four) hours as needed for moderate pain or severe pain. For no more than 5 days. 08/23/17    Corky Sing, PA-C  oxyCODONE-acetaminophen (PERCOCET/ROXICET) 5-325 MG tablet Take 1 tablet by mouth every 6 (six) hours as needed for severe pain. 03/15/22   Lacretia Leigh, MD  pantoprazole (PROTONIX) 20 MG tablet Take 1 tablet (20 mg total) by mouth 2 (two) times daily before a meal. 03/15/22   Lacretia Leigh, MD  ranitidine (ZANTAC) 150 MG capsule Take 150 mg by mouth daily as needed for heartburn.    [provider]  senna (SENOKOT) 8.6 MG TABS tablet Take 2 tablets (17.2 mg total) by mouth 2 (two) times daily. 08/23/17   Corky Sing, PA-C  sucralfate (CARAFATE) 1 g tablet Take 1 tablet (1 g total) by mouth 4 (four) times daily. 03/15/22   Lacretia Leigh, MD      Allergies    Patient has no known allergies.    Review of Systems   Review of Systems  Skin:  Positive for wound.  All other systems reviewed and are negative.   Physical Exam Updated Vital Signs BP (!) 164/87 (BP Location: Right Arm)   Pulse 66   Temp 98.1 F (36.7 C) (Oral)   Resp 18   Ht '5\' 10"'$  (1.778 m)   Wt 88.5 kg   SpO2 97%   BMI 27.98 kg/m  Physical Exam Vitals and nursing note reviewed.  Constitutional:      General: He is not in acute distress.  Appearance: Normal appearance. He is normal weight. He is not ill-appearing, toxic-appearing or diaphoretic.  HENT:     Head: Normocephalic and atraumatic.  Cardiovascular:     Rate and Rhythm: Normal rate.  Pulmonary:     Effort: Pulmonary effort is normal. No respiratory distress.  Musculoskeletal:        General: Normal range of motion.     Cervical back: Normal range of motion.     Comments: Superficial avulsion wound noted to the medial distal end of left index finger. The medial portion of the nail is removed as well. No subungal hematoma or wound tracking underneath the nailbed.  Capillary refill and pulses intact.  Patient has full range of motion of finger. Wound is slowly oozing blood but no pulsatile blood flow  Skin:     General: Skin is warm and dry.  Neurological:     General: No focal deficit present.     Mental Status: He is alert.  Psychiatric:        Mood and Affect: Mood normal.        Behavior: Behavior normal.     ED Results / Procedures / Treatments   Labs (all labs ordered are listed, but only abnormal results are displayed) Labs Reviewed - No data to display  EKG None  Radiology No results found.  Procedures Cauterization  Date/Time: 04/26/2022 4:19 PM  Performed by: Bud Face, PA-C Authorized by: Bud Face, PA-C  Consent: Verbal consent obtained. Risks and benefits: risks, benefits and alternatives were discussed Consent given by: patient Patient understanding: patient states understanding of the procedure being performed Patient consent: the patient's understanding of the procedure matches consent given Procedure consent: procedure consent matches procedure scheduled Relevant documents: relevant documents present and verified Test results: test results available and properly labeled Site marked: the operative site was marked Imaging studies: imaging studies available Required items: required blood products, implants, devices, and special equipment available Patient identity confirmed: verbally with patient Time out: Immediately prior to procedure a "time out" was called to verify the correct patient, procedure, equipment, support staff and site/side marked as required. Local anesthesia used: no  Anesthesia: Local anesthesia used: no  Sedation: Patient sedated: no  Patient tolerance: patient tolerated the procedure well with no immediate complications Comments: Hemostasis achieved with QuikClot pad       Medications Ordered in ED Medications - No data to display  ED Course/ Medical Decision Making/ A&P                           Medical Decision Making  Patient presents today with complaints of left index finger wound. He is afebrile, non-toxic  appearing, and in no acute distress with reassuring vital signs. Physical exam reveals oozing blood coming from the medial left index finger wound. No pulsatile blood. No subungal hematoma or nailbed involvement requiring removal of the nail. Wound not amenable to sutures given avulsion injury. Hemostasis achieved with quikclot pad and applying pressure for 5 minutes. Pad left in place and non-adherent dressing placed over top of the wound with coban over the non-adherent dressing. After observation for 10 minutes, no signs of bleeding through the dressing. No further emergent concerns at this time, patient is stable to be discharged. Given instructions to leave dressing in place for 24-48 hours and to follow up with his pcp for a wound check as well. Patient is understanding and amenable with plan, educated on red  flag symptoms that would prompt immediate return. Patient discharged in stable condition.   Final Clinical Impression(s) / ED Diagnoses Final diagnoses:  Injury of finger of left hand, initial encounter    Rx / DC Orders ED Discharge Orders     None     An After Visit Summary was printed and given to the patient.     Nestor Lewandowsky 04/26/22 1628    Pattricia Boss, MD 04/28/22 1500

## 2022-04-26 NOTE — ED Triage Notes (Signed)
Pt c/o kitchen knife incident that occurred last night cutting his left pointer finger. Wants eval.

## 2022-05-16 DIAGNOSIS — U071 COVID-19: Secondary | ICD-10-CM | POA: Diagnosis not present

## 2022-08-28 ENCOUNTER — Other Ambulatory Visit: Payer: Self-pay | Admitting: Physician Assistant

## 2022-08-28 ENCOUNTER — Ambulatory Visit
Admission: RE | Admit: 2022-08-28 | Discharge: 2022-08-28 | Disposition: A | Discharge status: Home / Self Care | Payer: Medicare HMO | Source: Ambulatory Visit | Attending: Physician Assistant | Admitting: Physician Assistant

## 2022-08-28 DIAGNOSIS — R202 Paresthesia of skin: Secondary | ICD-10-CM | POA: Diagnosis not present

## 2022-08-28 DIAGNOSIS — M25552 Pain in left hip: Secondary | ICD-10-CM

## 2022-08-28 DIAGNOSIS — M5136 Other intervertebral disc degeneration, lumbar region: Secondary | ICD-10-CM | POA: Diagnosis not present

## 2022-08-28 DIAGNOSIS — K644 Residual hemorrhoidal skin tags: Secondary | ICD-10-CM | POA: Diagnosis not present

## 2022-08-28 DIAGNOSIS — Z0000 Encounter for general adult medical examination without abnormal findings: Secondary | ICD-10-CM | POA: Diagnosis not present

## 2022-08-28 DIAGNOSIS — E786 Lipoprotein deficiency: Secondary | ICD-10-CM | POA: Diagnosis not present

## 2022-08-28 DIAGNOSIS — K219 Gastro-esophageal reflux disease without esophagitis: Secondary | ICD-10-CM | POA: Diagnosis not present

## 2022-08-28 DIAGNOSIS — D229 Melanocytic nevi, unspecified: Secondary | ICD-10-CM | POA: Diagnosis not present

## 2022-09-20 ENCOUNTER — Ambulatory Visit: Payer: Medicare HMO | Admitting: Orthopaedic Surgery

## 2022-09-20 ENCOUNTER — Other Ambulatory Visit: Payer: Self-pay

## 2022-09-20 DIAGNOSIS — G8929 Other chronic pain: Secondary | ICD-10-CM

## 2022-09-20 DIAGNOSIS — M5442 Lumbago with sciatica, left side: Secondary | ICD-10-CM | POA: Diagnosis not present

## 2022-09-20 NOTE — Progress Notes (Signed)
David Dillon is a 72 year old gentleman I am seeing for the first time.  He is younger appearing than his stated age.  He is athletic and does a lot of walking.  For about a few months now he has been having pain and he points to the posterior aspect of his left hip and pelvis area as a source of his pain.  He denies any groin pain denies any radicular symptoms.  Occasionally he gets a catching pain back there and there is been no known injury.  He is not a diabetic.  He has not had surgery on this area before.  He was going to start physical therapy yesterday but the physical therapist thought it was a better idea to have his appointment with [place] today since this was all established by his primary care physician.  On exam both his left and right hips move smoothly and fluidly.  There is no blocks or rotation of his hips.  There is no pain in the groin and no pain to palpation over the left hip trochanteric area.  His pain is to palpation in the posterior pelvis on his left side and some of this radiates into the ischium and some to the sciatic region.  He has a negative straight leg raise and 5 out of 5 strength of his bilateral lower extremities.  2 views of his left hip on the canopy system reviewed and these appear normal of the left hip.  The joint spaces well-maintained.  The left SI joint also appears normal.  I did see a CT scan that was done of his abdomen pelvis back in October and he does have some arthritic changes in the lumbar spine but no malalignment and the disc heights appear well-maintained.  I would like to actually send him to outpatient physical therapy for any modalities that can decrease the pain in the left side of his pelvis whether it is SI joint or sciatic related.  It certainly could be of the hamstring area as well.  His pain is not severe but he does get sometimes a catching sensation with certain motions.  He agrees with trying a course of physical therapy and then we can go from there  in terms of what other modalities or imaging studies he may need if therapy does not help.

## 2022-09-25 NOTE — Therapy (Unsigned)
OUTPATIENT PHYSICAL THERAPY THORACOLUMBAR EVALUATION   Patient Name: David Dillon MRN: [id number] DOB:1951/03/11, 72 y.o., male Today's Date: 09/26/2022  END OF SESSION:  PT End of Session - 09/26/22 1001     Visit Number 1    Number of Visits 8    Date for PT Re-Evaluation 11/21/22    Authorization Type [employer]IC[employer]    Progress Note Due on Visit 10    PT Start Time 0920    PT Stop Time 1010    PT Time Calculation (min) 50 min             Past Medical History:  Diagnosis Date   DDD (degenerative disc disease), lumbosacral    Dental bridge present    lower   Environmental allergies    pollen, grass, trees   GERD (gastroesophageal reflux disease)    History of asthma    > 30 years ago   Immature cataract of both eyes    OA (osteoarthritis)    bilateral hand   Osteochondral lesion of talar dome 08/2017   right   Sinus headache    Urinary frequency    Past Surgical History:  Procedure Laterality Date   ANKLE ARTHROSCOPY WITH DRILLING/MICROFRACTURE Right 08/23/2017   Procedure: Ankle Arthroscopy with Extensive Debridement and Grafting of the Osteochondral Lesions of Talus;  Surgeon: Toni Arthurs, MD;  Location: [facility];  Service: Orthopedics;  Laterality: Right;   COLONOSCOPY  03/2002   DENTAL SURGERY     VASECTOMY     Patient Active Problem List   Diagnosis Date Noted   Right ankle pain 05/02/2016    PCP: Ceasar Lund, PA  REFERRING PROVIDER: Kathryne Hitch, MD  REFERRING DIAG: Chronic left-sided low back pain with left-sided sciatica [M54.42, G89.29]   Rationale for Evaluation and Treatment: Rehabilitation  THERAPY DIAG:  Muscle weakness (generalized) - Plan: PT plan of care cert/re-cert  Other low back pain - Plan: PT plan of care cert/re-cert  Chronic left-sided low back pain with left-sided sciatica - Plan: PT plan of care cert/re-cert  ONSET DATE: a few months ago   SUBJECTIVE:                                                                                                                                                                                            SUBJECTIVE STATEMENT: Pt reports an insidious onset of L LE pain with mild intermittent tingling that started a few months ago. He reports being very active, hiking a few miles 3-4 x per week. Pt noted most pain with getting in/ out of a car, getting  in out of a chair or when ER L LE.  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   PERTINENT HISTORY:  DDD, GERD, OA (bilat hands).   PAIN:  Are you having pain? Yes: NPRS scale: 3-4, sometimes 6-7/10 Pain location: L hip  Pain description: Sharp pain in L hip Aggravating factors: Getting in/ out of chair/ car, walking.  Relieving factors: Ice, tylenol  PRECAUTIONS: None  WEIGHT BEARING RESTRICTIONS: No  FALLS:  Has patient fallen in last 6 months? No  LIVING ENVIRONMENT: Lives with: lives with their family Lives in: House/apartment Stairs: Yes: Internal: 12 steps; on right going up and External: 3 steps; bilateral but cannot reach both Has following equipment at home: None  OCCUPATION: Retired   PLOF: Independent  PATIENT GOALS: Pt would like to eliminate pain and be able to return to walking without onset of familiar pain.   NEXT MD VISIT: 5/22  OBJECTIVE:   DIAGNOSTIC FINDINGS:  There is no evidence of hip fracture or dislocation. There is no evidence of arthropathy or other focal bone abnormality.  PATIENT SURVEYS:  FOTO 69.07%, 76% predicted.   SCREENING FOR RED FLAGS: Bowel or bladder incontinence: Yes: frequency   COGNITION: Overall cognitive status: Within functional limits for tasks assessed   SENSATION: WFL  MUSCLE LENGTH: Hamstrings: Right 75% deg; Left 75% deg  POSTURE: No Significant postural limitations  PALPATION: Tenderness isolated to L piriformis.   LUMBAR ROM:   AROM eval  Flexion WFL (tight hamstrings)    Extension WFL  Right lateral flexion WFL  Left lateral flexion WFL  Right rotation WFL (symptoms present at end range)   Left rotation WFL   (Blank rows = not tested)  LOWER EXTREMITY ROM:     Active  Right eval Left eval  Hip flexion [facility]  Hip external rotation WFL 85%   Knee flexion [facility] WFL  Knee extension WFL WFL   (Blank rows = not tested)  LOWER EXTREMITY MMT:    MMT Right eval Left eval  Hip flexion 5/5 5/5  Hip extension    Hip abduction 4/5 4-/5  Knee flexion 5/5 5/5  Knee extension 5/5 5/5   (Blank rows = not tested)  LUMBAR SPECIAL TESTS:  Straight leg raise test: Negative and Slump test: Negative  FUNCTIONAL TESTS:  5 times sit to stand: 10.99 sec   GAIT: Distance walked: 35ft Assistive device utilized: None Level of assistance: Complete Independence Comments: No significant gait deviations.   TODAY'S TREATMENT:                                                                                                                              DATE: Creating, reviewing, and completing below HEP   PATIENT EDUCATION:  Education details: Educated pt on anatomy and physiology of current symptoms, FOTO, diagnosis, prognosis, HEP,  and POC. Person educated: Patient Education method: Medical illustrator Education comprehension: verbalized understanding  and returned demonstration  HOME EXERCISE PROGRAM: Access Code: [email or web address] URL: [email or web address] Date: 09/26/2022 Prepared by: Royal Hawthorn  Exercises - Supine Lower Trunk Rotation  - 2 x daily - 7 x weekly - 2 sets - 10 reps - Supine Piriformis Stretch with Foot on Ground  - 2 x daily - 7 x weekly - 2 sets - 10 reps - 30 hold - Hooklying Hamstring Stretch with Strap  - 2 x daily - 7 x weekly - 2 sets - 10 reps - 30 hold   ASSESSMENT:  CLINICAL IMPRESSION: Patient referred to PT for L hip and lower back pain. He is very active and demonstrates functional ROM and strength. He is  most limited with hip L hip ER and abductor weakness. Pt with positive grind test on L hip, indicating some mild OA present. He complains of mild pain in his groin with end range hip flexion. Discussed DN with pt with pt verbally reporting interest. He was provided an initial HEP to help minimize symptoms. Patient will benefit from skilled PT to address below impairments, limitations and improve overall function.  OBJECTIVE IMPAIRMENTS: decreased activity tolerance, difficulty walking, decreased balance, decreased endurance, decreased mobility, decreased ROM, decreased strength, impaired flexibility, impaired UE/LE use, postural dysfunction, and pain.  ACTIVITY LIMITATIONS: bending, lifting, carry, locomotion, cleaning, community activity, driving, and or occupation  PERSONAL FACTORS: DDD, GERD, OA (bilat hands) are also affecting patient's functional outcome.  REHAB POTENTIAL: Good  CLINICAL DECISION MAKING: Stable/uncomplicated  EVALUATION COMPLEXITY: Low    GOALS: Short term PT Goals Target date: 10/10/2022  Pt will be I and compliant with HEP. Baseline:  Goal status: New Pt will decrease pain by 25% overall Baseline: Goal status: New  Long term PT goals Target date: 11/21/2022  Pt will improve L hip ER ROM to [facility] to improve functional mobility Baseline: Goal status: New Pt will improve L hip abductor strength to at least 4+/5 MMT to improve functional strength Baseline: Goal status: New Pt will improve FOTO to at least 76% functional to show improved function Baseline: Goal status: New Pt will reduce pain by overall 50% overall with usual walking, getting in/ out of car/ chair.  Baseline: Goal status: New Pt will reduce pain to overall less than 2-3/10 with usual activity and work activity. Baseline: Goal status: New  PLAN: PT FREQUENCY: 1x times per week   PT DURATION: 6-8 weeks  PLANNED INTERVENTIONS (unless contraindicated): aquatic PT, Canalith repositioning,  cryotherapy, Electrical stimulation, Iontophoresis with 4 mg/ml dexamethasome, Moist heat, traction, Ultrasound, gait training, Therapeutic exercise, balance training, neuromuscular re-education, patient/family education, prosthetic training, manual techniques, passive ROM, dry needling, taping, vasopnuematic device, vestibular, spinal manipulations, joint manipulations  PLAN FOR NEXT SESSION: update/ review HEP, TPDN to L piriformis, glute and HS strengthening.    Champ Mungo, PT 09/26/2022, 10:21 AM  Referring diagnosis? Chronic left-sided low back pain with left-sided sciatica [M54.42, G89.29]  Treatment diagnosis? (if different than referring diagnosis) Other low back pain, Chronic left-sided low back pain with left-sided sciatica, muscle weakness  What was this (referring dx) caused by? []  Surgery []  Fall [x]  Ongoing issue []  Arthritis []  Other: ____________  Laterality: []  Rt [x]  Lt []  Both  Check all possible CPT codes:  *CHOOSE 10 OR LESS*    [x]  97110 (Therapeutic Exercise)  []  92507 (SLP Treatment)  [x]  97112 (Neuro Re-ed)   []  92526 (Swallowing Treatment)   [x]  97116 (Gait Training)   []  [id number] (Cognitive Training, 1st 15 minutes) [x]  97140 (  Manual Therapy)   []  97130 (Cognitive Training, each add'l 15 minutes)  [x]  97164 (Re-evaluation)                              []  Other, List CPT Code ____________  [x]  97530 (Therapeutic Activities)     [x]  97535 (Self Care)   []  All codes above (97110 - 97535)  [x]  97012 (Mechanical Traction)  [x]  97014 (E-stim Unattended)  []  97032 (E-stim manual)  []  16109 (Ionto)  []  97035 (Ultrasound) []  97750 (Physical Performance Training) []  [id number] (Aquatic Therapy) [x]  97016 (Vasopneumatic Device) []  [id number] (Paraffin) []  97034 (Contrast Bath) []  97597 (Wound Care 1st 20 sq cm) []  97598 (Wound Care each add'l 20 sq cm) []  97760 (Orthotic Fabrication, Fitting, Training Initial) []  [id number] (Prosthetic Management and Training Initial) []   [id number] (Orthotic or Prosthetic Training/ Modification Subsequent)

## 2022-09-26 ENCOUNTER — Ambulatory Visit: Payer: Medicare HMO | Admitting: Physical Therapy

## 2022-09-26 ENCOUNTER — Other Ambulatory Visit: Payer: Self-pay

## 2022-09-26 DIAGNOSIS — G8929 Other chronic pain: Secondary | ICD-10-CM | POA: Diagnosis not present

## 2022-09-26 DIAGNOSIS — M5459 Other low back pain: Secondary | ICD-10-CM

## 2022-09-26 DIAGNOSIS — M6281 Muscle weakness (generalized): Secondary | ICD-10-CM | POA: Diagnosis not present

## 2022-09-26 DIAGNOSIS — M5442 Lumbago with sciatica, left side: Secondary | ICD-10-CM | POA: Diagnosis not present

## 2022-09-26 NOTE — Patient Instructions (Signed)
dry

## 2022-10-03 ENCOUNTER — Encounter: Payer: Self-pay | Admitting: Physical Therapy

## 2022-10-03 ENCOUNTER — Ambulatory Visit: Payer: Medicare HMO | Admitting: Physical Therapy

## 2022-10-03 DIAGNOSIS — M5442 Lumbago with sciatica, left side: Secondary | ICD-10-CM | POA: Diagnosis not present

## 2022-10-03 DIAGNOSIS — G8929 Other chronic pain: Secondary | ICD-10-CM | POA: Diagnosis not present

## 2022-10-03 DIAGNOSIS — M6281 Muscle weakness (generalized): Secondary | ICD-10-CM | POA: Diagnosis not present

## 2022-10-03 DIAGNOSIS — M5459 Other low back pain: Secondary | ICD-10-CM

## 2022-10-03 NOTE — Therapy (Signed)
OUTPATIENT PHYSICAL THERAPY TREATMENT   Patient Name: David Dillon MRN: [id number] DOB:[birth year], 72 y.o., male Today's Date: 10/03/2022  END OF SESSION:    Past Medical History:  Diagnosis Date   DDD (degenerative disc disease), lumbosacral    Dental bridge present    lower   Environmental allergies    pollen, grass, trees   GERD (gastroesophageal reflux disease)    History of asthma    > 30 years ago   Immature cataract of both eyes    OA (osteoarthritis)    bilateral hand   Osteochondral lesion of talar dome 08/2017   right   Sinus headache    Urinary frequency    Past Surgical History:  Procedure Laterality Date   ANKLE ARTHROSCOPY WITH DRILLING/MICROFRACTURE Right 08/23/2017   Procedure: Ankle Arthroscopy with Extensive Debridement and Grafting of the Osteochondral Lesions of Talus;  Surgeon: Toni Arthurs, MD;  Location: [facility];  Service: Orthopedics;  Laterality: Right;   COLONOSCOPY  03/2002   DENTAL SURGERY     VASECTOMY     Patient Active Problem List   Diagnosis Date Noted   Right ankle pain 05/02/2016    PCP: Ceasar Lund, PA  REFERRING PROVIDER: Kathryne Hitch, MD  REFERRING DIAG: Chronic left-sided low back pain with left-sided sciatica [M54.42, G89.29]   Rationale for Evaluation and Treatment: Rehabilitation  THERAPY DIAG:  No diagnosis found.  ONSET DATE: a few months ago   SUBJECTIVE:                                                                                                                                                                                           SUBJECTIVE STATEMENT: Pt reports the pain in his posterior left hip has improved with stretching but he does report some pain more in anterior left hip and some difficulty straightening up after sitting.    PERTINENT HISTORY:  DDD, GERD, OA (bilat hands).   PAIN:  Are you having pain? Yes: NPRS scale: 2-3 today maybe/10 Pain location: L  hip  Pain description: Sharp pain in L hip Aggravating factors: Getting in/ out of chair/ car, walking.  Relieving factors: Ice, tylenol  PRECAUTIONS: None  WEIGHT BEARING RESTRICTIONS: No  FALLS:  Has patient fallen in last 6 months? No  LIVING ENVIRONMENT: Lives with: lives with their family Lives in: House/apartment Stairs: Yes: Internal: 12 steps; on right going up and External: 3 steps; bilateral but cannot reach both Has following equipment at home: None  OCCUPATION: Retired   PLOF: Independent  PATIENT GOALS: Pt would like to eliminate pain and be able to return  to walking without onset of familiar pain.   NEXT MD VISIT: 5/22  OBJECTIVE:   DIAGNOSTIC FINDINGS:  There is no evidence of hip fracture or dislocation. There is no evidence of arthropathy or other focal bone abnormality.  PATIENT SURVEYS:  FOTO 69.07%, 76% predicted.   SCREENING FOR RED FLAGS: Bowel or bladder incontinence: Yes: frequency   COGNITION: Overall cognitive status: Within functional limits for tasks assessed   SENSATION: WFL  MUSCLE LENGTH: Hamstrings: Right 75% deg; Left 75% deg  POSTURE: No Significant postural limitations  PALPATION: Tenderness isolated to L piriformis.   LUMBAR ROM:   AROM eval  Flexion WFL (tight hamstrings)   Extension WFL  Right lateral flexion WFL  Left lateral flexion WFL  Right rotation WFL (symptoms present at end range)   Left rotation WFL   (Blank rows = not tested)  LOWER EXTREMITY ROM:     Active  Right eval Left eval  Hip flexion [facility]  Hip external rotation WFL 85%   Knee flexion [facility] WFL  Knee extension WFL WFL   (Blank rows = not tested)  LOWER EXTREMITY MMT:    MMT Right eval Left eval  Hip flexion 5/5 5/5  Hip extension    Hip abduction 4/5 4-/5  Knee flexion 5/5 5/5  Knee extension 5/5 5/5   (Blank rows = not tested)  LUMBAR SPECIAL TESTS:  Straight leg raise test: Negative and Slump test: Negative  FUNCTIONAL  TESTS:  5 times sit to stand: 10.99 sec   GAIT: Distance walked: 70ft Assistive device utilized: None Level of assistance: Complete Independence Comments: No significant gait deviations.   TODAY'S TREATMENT:                                                                                                                              DATE: 10/03/22 Recumbent bike L5 X 6 min Manual therapy for skilled palpation and active compression with  Trigger Point Dry-Needling  Treatment instructions: Expect mild to moderate muscle soreness. Patient Consent Given: Yes Education handout provided: ves provided Muscles treated: Left sided for: Lumbar paraspinals and multifidi, glutes and piriformis, TFL, hip flexor Treatment response/outcome: good overall tolerance,twitch response noted  Supine hip flexor/quad stretch with strap 20 sec X 3 Supine piriformis stretch 30 sec X 2 knee to opposite shoulder and 30 sec X 2 figure 4 Supine hamstring stretch 3 X 30 sec with strap Supine bridges 5 sec hold X 10 HEP updates and review   PATIENT EDUCATION:  Education details: Educated pt on anatomy and physiology of current symptoms, FOTO, diagnosis, prognosis, HEP,  and POC. Person educated: Patient Education method: Medical illustrator Education comprehension: verbalized understanding and returned demonstration  HOME EXERCISE PROGRAM: Access Code: [email or web address] URL: [email or web address] Date: 10/03/2022 Prepared by: Ivery Quale  Exercises - Supine Lower Trunk Rotation  - 2 x daily - 7 x weekly - 1 sets - 10 reps - Supine Piriformis Stretch with Foot  on Ground  - 2 x daily - 7 x weekly - 1 sets - 2-3 reps - 30 hold - Hooklying Hamstring Stretch with Strap  - 2 x daily - 7 x weekly - 1 sets - 2-3 reps - 30 hold - Supine Quadriceps Stretch with Strap on Table  - 2 x daily - 6 x weekly - 1 sets - 2-3 reps - 20 hold - Supine Bridge  - 2 x daily - 6 x weekly - 1-2 sets - 10 reps - 5  hold  ASSESSMENT:  CLINICAL IMPRESSION: He had pain more located in anterior hip that presented as hip flexor pathology or tendonitis. I did give him a stretch for this and we tried DN today to his trigger points in glutes/piriformis, TFL, and hip flexor. No immediate adverse response to this and we will assess how he did with this next time.   OBJECTIVE IMPAIRMENTS: decreased activity tolerance, difficulty walking, decreased balance, decreased endurance, decreased mobility, decreased ROM, decreased strength, impaired flexibility, impaired UE/LE use, postural dysfunction, and pain.  ACTIVITY LIMITATIONS: bending, lifting, carry, locomotion, cleaning, community activity, driving, and or occupation  PERSONAL FACTORS: DDD, GERD, OA (bilat hands) are also affecting patient's functional outcome.  REHAB POTENTIAL: Good  CLINICAL DECISION MAKING: Stable/uncomplicated  EVALUATION COMPLEXITY: Low    GOALS: Short term PT Goals Target date: 10/10/2022  Pt will be I and compliant with HEP. Baseline:  Goal status: New Pt will decrease pain by 25% overall Baseline: Goal status: New  Long term PT goals Target date: 11/21/2022  Pt will improve L hip ER ROM to [facility] to improve functional mobility Baseline: Goal status: New Pt will improve L hip abductor strength to at least 4+/5 MMT to improve functional strength Baseline: Goal status: New Pt will improve FOTO to at least 76% functional to show improved function Baseline: Goal status: New Pt will reduce pain by overall 50% overall with usual walking, getting in/ out of car/ chair.  Baseline: Goal status: New Pt will reduce pain to overall less than 2-3/10 with usual activity and work activity. Baseline: Goal status: New  PLAN: PT FREQUENCY: 1x times per week   PT DURATION: 6-8 weeks  PLANNED INTERVENTIONS (unless contraindicated): aquatic PT, Canalith repositioning, cryotherapy, Electrical stimulation, Iontophoresis with 4 mg/ml  dexamethasome, Moist heat, traction, Ultrasound, gait training, Therapeutic exercise, balance training, neuromuscular re-education, patient/family education, prosthetic training, manual techniques, passive ROM, dry needling, taping, vasopnuematic device, vestibular, spinal manipulations, joint manipulations  PLAN FOR NEXT SESSION: how was DN? Add more glute and HS strengthening as tolerated   April Manson, PT 10/03/2022, 1:31 PM  Referring diagnosis? Chronic left-sided low back pain with left-sided sciatica [M54.42, G89.29]  Treatment diagnosis? (if different than referring diagnosis) Other low back pain, Chronic left-sided low back pain with left-sided sciatica, muscle weakness  What was this (referring dx) caused by? []  Surgery []  Fall [x]  Ongoing issue []  Arthritis []  Other: ____________  Laterality: []  Rt [x]  Lt []  Both  Check all possible CPT codes:  *CHOOSE 10 OR LESS*    [x]  97110 (Therapeutic Exercise)  []  92507 (SLP Treatment)  [x]  97112 (Neuro Re-ed)   []  92526 (Swallowing Treatment)   [x]  40981 (Gait Training)   []  [id number] (Cognitive Training, 1st 15 minutes) [x]  97140 (Manual Therapy)   []  97130 (Cognitive Training, each add'l 15 minutes)  [x]  97164 (Re-evaluation)                              []   Other, List CPT Code ____________  [x]  97530 (Therapeutic Activities)     [x]  97535 (Self Care)   []  All codes above (97110 - 97535)  [x]  40981 (Mechanical Traction)  [x]  97014 (E-stim Unattended)  []  97032 (E-stim manual)  []  97033 (Ionto)  []  97035 (Ultrasound) []  97750 (Physical Performance Training) []  [id number] (Aquatic Therapy) [x]  97016 (Vasopneumatic Device) []  [id number] (Paraffin) []  97034 (Contrast Bath) []  97597 (Wound Care 1st 20 sq cm) []  97598 (Wound Care each add'l 20 sq cm) []  97760 (Orthotic Fabrication, Fitting, Training Initial) []  [id number] (Prosthetic Management and Training Initial) []  [id number] (Orthotic or Prosthetic Training/ Modification Subsequent)

## 2022-10-10 ENCOUNTER — Ambulatory Visit: Payer: Medicare HMO | Admitting: Physical Therapy

## 2022-10-10 ENCOUNTER — Encounter: Payer: Self-pay | Admitting: Physical Therapy

## 2022-10-10 DIAGNOSIS — G8929 Other chronic pain: Secondary | ICD-10-CM | POA: Diagnosis not present

## 2022-10-10 DIAGNOSIS — M5442 Lumbago with sciatica, left side: Secondary | ICD-10-CM

## 2022-10-10 DIAGNOSIS — M5459 Other low back pain: Secondary | ICD-10-CM

## 2022-10-10 DIAGNOSIS — M6281 Muscle weakness (generalized): Secondary | ICD-10-CM

## 2022-10-10 NOTE — Therapy (Signed)
OUTPATIENT PHYSICAL THERAPY TREATMENT   Patient Name: David Dillon MRN: [id number] DOB:06/18/1950, 72 y.o., male Today's Date: 10/10/2022  END OF SESSION:  PT End of Session - 10/10/22 0918     Visit Number 3    Number of Visits 8    Date for PT Re-Evaluation 11/21/22    Authorization Type [employer] MEDICARE    Progress Note Due on Visit 10    PT Start Time 0918    PT Stop Time 0954    PT Time Calculation (min) 36 min    Activity Tolerance Patient tolerated treatment well    Behavior During Therapy WFL for tasks assessed/performed              Past Medical History:  Diagnosis Date   DDD (degenerative disc disease), lumbosacral    Dental bridge present    lower   Environmental allergies    pollen, grass, trees   GERD (gastroesophageal reflux disease)    History of asthma    > 30 years ago   Immature cataract of both eyes    OA (osteoarthritis)    bilateral hand   Osteochondral lesion of talar dome 08/2017   right   Sinus headache    Urinary frequency    Past Surgical History:  Procedure Laterality Date   ANKLE ARTHROSCOPY WITH DRILLING/MICROFRACTURE Right 08/23/2017   Procedure: Ankle Arthroscopy with Extensive Debridement and Grafting of the Osteochondral Lesions of Talus;  Surgeon: Toni Arthurs, MD;  Location: [facility];  Service: Orthopedics;  Laterality: Right;   COLONOSCOPY  03/2002   DENTAL SURGERY     VASECTOMY     Patient Active Problem List   Diagnosis Date Noted   Right ankle pain 05/02/2016    PCP: Ceasar Lund, PA  REFERRING PROVIDER: Kathryne Hitch, MD  REFERRING DIAG: Chronic left-sided low back pain with left-sided sciatica [M54.42, G89.29]   Rationale for Evaluation and Treatment: Rehabilitation  THERAPY DIAG:  Muscle weakness (generalized)  Other low back pain  Chronic left-sided low back pain with left-sided sciatica  ONSET DATE: a few months ago   SUBJECTIVE:                                                                                                                                                                                            SUBJECTIVE STATEMENT: He relays the pain has improved some, he was very sore after DN but then had less overall pain then before. He is able to lift his leg better to get on his underwear. He does still report some deep pain in his groin with certain  movements or activity.   PERTINENT HISTORY:  DDD, GERD, OA (bilat hands).   PAIN:  Are you having pain? Yes: NPRS scale: 0 at rest today coming in, can still get deep hip pain with activity/10 Pain location: L hip  Pain description: Sharp pain in L hip Aggravating factors: Getting in/ out of chair/ car, walking.  Relieving factors: Ice, tylenol  PRECAUTIONS: None  WEIGHT BEARING RESTRICTIONS: No  FALLS:  Has patient fallen in last 6 months? No  LIVING ENVIRONMENT: Lives with: lives with their family Lives in: House/apartment Stairs: Yes: Internal: 12 steps; on right going up and External: 3 steps; bilateral but cannot reach both Has following equipment at home: None  OCCUPATION: Retired   PLOF: Independent  PATIENT GOALS: Pt would like to eliminate pain and be able to return to walking without onset of familiar pain.   NEXT MD VISIT: 5/22  OBJECTIVE:   DIAGNOSTIC FINDINGS:  There is no evidence of hip fracture or dislocation. There is no evidence of arthropathy or other focal bone abnormality.  PATIENT SURVEYS:  FOTO 69.07%, 76% predicted.   SCREENING FOR RED FLAGS: Bowel or bladder incontinence: Yes: frequency   COGNITION: Overall cognitive status: Within functional limits for tasks assessed   SENSATION: WFL  MUSCLE LENGTH: Hamstrings: Right 75% deg; Left 75% deg  POSTURE: No Significant postural limitations  PALPATION: Tenderness isolated to L piriformis.   LUMBAR ROM:   AROM eval  Flexion WFL (tight hamstrings)   Extension WFL  Right lateral flexion WFL   Left lateral flexion WFL  Right rotation WFL (symptoms present at end range)   Left rotation WFL   (Blank rows = not tested)  LOWER EXTREMITY ROM:     Active  Right eval Left eval  Hip flexion [facility]  Hip external rotation WFL 85%   Knee flexion [facility] WFL  Knee extension WFL WFL   (Blank rows = not tested)  LOWER EXTREMITY MMT:    MMT Right eval Left eval  Hip flexion 5/5 5/5  Hip extension    Hip abduction 4/5 4-/5  Knee flexion 5/5 5/5  Knee extension 5/5 5/5   (Blank rows = not tested)  LUMBAR SPECIAL TESTS:  Straight leg raise test: Negative and Slump test: Negative  FUNCTIONAL TESTS:  5 times sit to stand: 10.99 sec   GAIT: Distance walked: 70ft Assistive device utilized: None Level of assistance: Complete Independence Comments: No significant gait deviations.   TODAY'S TREATMENT:                                                                                                                              DATE: 10/10/22 Recumbent bike L5 X 6 min Standing hip abduction X 15 bilat green band Standing hip extension X 15 bilat green band Standing hip march x 15 bilat green band Manual therapy for skilled palpation and active compression with  Trigger Point Dry-Needling  Treatment instructions: Expect mild to  moderate muscle soreness. Patient Consent Given: Yes Education handout provided: ves provided Muscles treated: Left sided for: Lumbar paraspinals and multifidi, glutes and piriformis, TFL, hip flexor Treatment response/outcome: good overall tolerance,twitch response noted  Supine hip flexor/quad stretch with strap 20 sec X 3 Supine piriformis stretch 30 sec X 3 knee to opposite shoulder and 30 sec X 3 figure 4 Supine hamstring stretch 3 X 30 sec with strap   10/03/22 Recumbent bike L5 X 6 min Manual therapy for skilled palpation and active compression with  Trigger Point Dry-Needling  Treatment instructions: Expect mild to moderate muscle  soreness. Patient Consent Given: Yes Education handout provided: ves provided Muscles treated: Left sided for: Lumbar paraspinals and multifidi, glutes and piriformis, TFL, hip flexor Treatment response/outcome: good overall tolerance,twitch response noted  Supine hip flexor/quad stretch with strap 20 sec X 3 Supine piriformis stretch 30 sec X 2 knee to opposite shoulder and 30 sec X 2 figure 4 Supine hamstring stretch 3 X 30 sec with strap Supine bridges 5 sec hold X 10 HEP updates and review   PATIENT EDUCATION:  Education details: Educated pt on anatomy and physiology of current symptoms, FOTO, diagnosis, prognosis, HEP,  and POC. Person educated: Patient Education method: Medical illustrator Education comprehension: verbalized understanding and returned demonstration  HOME EXERCISE PROGRAM: Access Code: [email or web address] URL: [email or web address] Date: 10/03/2022 Prepared by: Ivery Quale  Exercises - Supine Lower Trunk Rotation  - 2 x daily - 7 x weekly - 1 sets - 10 reps - Supine Piriformis Stretch with Foot on Ground  - 2 x daily - 7 x weekly - 1 sets - 2-3 reps - 30 hold - Hooklying Hamstring Stretch with Strap  - 2 x daily - 7 x weekly - 1 sets - 2-3 reps - 30 hold - Supine Quadriceps Stretch with Strap on Table  - 2 x daily - 6 x weekly - 1 sets - 2-3 reps - 20 hold - Supine Bridge  - 2 x daily - 6 x weekly - 1-2 sets - 10 reps - 5 hold  ASSESSMENT:  CLINICAL IMPRESSION: DN appeared to help some last time so this was continued. I did give him green band to take home for hip strengthening exercises and he has been compliant with is stretches. PT recommending to continue with current plan of care  OBJECTIVE IMPAIRMENTS: decreased activity tolerance, difficulty walking, decreased balance, decreased endurance, decreased mobility, decreased ROM, decreased strength, impaired flexibility, impaired UE/LE use, postural dysfunction, and pain.  ACTIVITY  LIMITATIONS: bending, lifting, carry, locomotion, cleaning, community activity, driving, and or occupation  PERSONAL FACTORS: DDD, GERD, OA (bilat hands) are also affecting patient's functional outcome.  REHAB POTENTIAL: Good  CLINICAL DECISION MAKING: Stable/uncomplicated  EVALUATION COMPLEXITY: Low    GOALS: Short term PT Goals Target date: 10/10/2022  Pt will be I and compliant with HEP. Baseline:  Goal status: New Pt will decrease pain by 25% overall Baseline: Goal status: New  Long term PT goals Target date: 11/21/2022  Pt will improve L hip ER ROM to [facility] to improve functional mobility Baseline: Goal status: New Pt will improve L hip abductor strength to at least 4+/5 MMT to improve functional strength Baseline: Goal status: New Pt will improve FOTO to at least 76% functional to show improved function Baseline: Goal status: New Pt will reduce pain by overall 50% overall with usual walking, getting in/ out of car/ chair.  Baseline: Goal status: New Pt will reduce pain to overall less than  2-3/10 with usual activity and work activity. Baseline: Goal status: New  PLAN: PT FREQUENCY: 1x times per week   PT DURATION: 6-8 weeks  PLANNED INTERVENTIONS (unless contraindicated): aquatic PT, Canalith repositioning, cryotherapy, Electrical stimulation, Iontophoresis with 4 mg/ml dexamethasome, Moist heat, traction, Ultrasound, gait training, Therapeutic exercise, balance training, neuromuscular re-education, patient/family education, prosthetic training, manual techniques, passive ROM, dry needling, taping, vasopnuematic device, vestibular, spinal manipulations, joint manipulations  PLAN FOR NEXT SESSION: DN if helpful, Add more glute and HS strengthening as tolerated   April Manson, PT 10/10/2022, 9:58 AM  Referring diagnosis? Chronic left-sided low back pain with left-sided sciatica [M54.42, G89.29]  Treatment diagnosis? (if different than referring diagnosis) Other low  back pain, Chronic left-sided low back pain with left-sided sciatica, muscle weakness  What was this (referring dx) caused by? []  Surgery []  Fall [x]  Ongoing issue []  Arthritis []  Other: ____________  Laterality: []  Rt [x]  Lt []  Both  Check all possible CPT codes:  *CHOOSE 10 OR LESS*    [x]  97110 (Therapeutic Exercise)  []  92507 (SLP Treatment)  [x]  97112 (Neuro Re-ed)   []  81191 (Swallowing Treatment)   [x]  97116 (Gait Training)   []  [id number] (Cognitive Training, 1st 15 minutes) [x]  97140 (Manual Therapy)   []  97130 (Cognitive Training, each add'l 15 minutes)  [x]  97164 (Re-evaluation)                              []  Other, List CPT Code ____________  [x]  97530 (Therapeutic Activities)     [x]  97535 (Self Care)   []  All codes above (97110 - 97535)  [x]  97012 (Mechanical Traction)  [x]  97014 (E-stim Unattended)  []  97032 (E-stim manual)  []  97033 (Ionto)  []  97035 (Ultrasound) []  97750 (Physical Performance Training) []  [id number] (Aquatic Therapy) [x]  97016 (Vasopneumatic Device) []  [id number] (Paraffin) []  97034 (Contrast Bath) []  97597 (Wound Care 1st 20 sq cm) []  97598 (Wound Care each add'l 20 sq cm) []  97760 (Orthotic Fabrication, Fitting, Training Initial) []  [id number] (Prosthetic Management and Training Initial) []  [id number] (Orthotic or Prosthetic Training/ Modification Subsequent)d

## 2022-10-17 ENCOUNTER — Ambulatory Visit: Payer: Medicare HMO | Admitting: Physical Therapy

## 2022-10-17 ENCOUNTER — Encounter: Payer: Self-pay | Admitting: Physical Therapy

## 2022-10-17 DIAGNOSIS — G8929 Other chronic pain: Secondary | ICD-10-CM | POA: Diagnosis not present

## 2022-10-17 DIAGNOSIS — M5442 Lumbago with sciatica, left side: Secondary | ICD-10-CM | POA: Diagnosis not present

## 2022-10-17 DIAGNOSIS — M6281 Muscle weakness (generalized): Secondary | ICD-10-CM | POA: Diagnosis not present

## 2022-10-17 DIAGNOSIS — M5459 Other low back pain: Secondary | ICD-10-CM

## 2022-10-17 NOTE — Therapy (Signed)
OUTPATIENT PHYSICAL THERAPY TREATMENT   Patient Name: David Dillon MRN: [id number] DOB:[birth year], 72 y.o., male Today's Date: 10/17/2022  END OF SESSION:  PT End of Session - 10/17/22 0935     Visit Number 4    Number of Visits 8    Date for PT Re-Evaluation 11/21/22    Authorization Type [employer]IC[employer]    Progress Note Due on Visit 10    PT Start Time 0929    PT Stop Time 1010    PT Time Calculation (min) 41 min    Activity Tolerance Patient tolerated treatment well    Behavior During Therapy WFL for tasks assessed/performed              Past Medical History:  Diagnosis Date   DDD (degenerative disc disease), lumbosacral    Dental bridge present    lower   Environmental allergies    pollen, grass, trees   GERD (gastroesophageal reflux disease)    History of asthma    > 30 years ago   Immature cataract of both eyes    OA (osteoarthritis)    bilateral hand   Osteochondral lesion of talar dome 08/2017   right   Sinus headache    Urinary frequency    Past Surgical History:  Procedure Laterality Date   ANKLE ARTHROSCOPY WITH DRILLING/MICROFRACTURE Right 08/23/2017   Procedure: Ankle Arthroscopy with Extensive Debridement and Grafting of the Osteochondral Lesions of Talus;  Surgeon: Toni Arthurs, MD;  Location: [facility];  Service: Orthopedics;  Laterality: Right;   COLONOSCOPY  03/2002   DENTAL SURGERY     VASECTOMY     Patient Active Problem List   Diagnosis Date Noted   Right ankle pain 05/02/2016    PCP: Ceasar Lund, PA  REFERRING PROVIDER: Kathryne Hitch, MD  REFERRING DIAG: Chronic left-sided low back pain with left-sided sciatica [M54.42, G89.29]   Rationale for Evaluation and Treatment: Rehabilitation  THERAPY DIAG:  Muscle weakness (generalized)  Other low back pain  Chronic left-sided low back pain with left-sided sciatica  ONSET DATE: a few months ago   SUBJECTIVE:                                                                                                                                                                                            SUBJECTIVE STATEMENT: He denies pain upon arrival however he does still have the same deep hip pain after sitting for a while or getting out of car. This pain has not really improved with PT but the pain he was having in his glutes have improved with PT.  PERTINENT HISTORY:  DDD, GERD, OA (bilat hands).   PAIN:  Are you having pain? Yes: NPRS scale: 0 at rest today coming in, can still get deep hip pain with activity/10 Pain location: L hip  Pain description: Sharp pain in L hip Aggravating factors: Getting in/ out of chair/ car, walking.  Relieving factors: Ice, tylenol  PRECAUTIONS: None  WEIGHT BEARING RESTRICTIONS: No  FALLS:  Has patient fallen in last 6 months? No  LIVING ENVIRONMENT: Lives with: lives with their family Lives in: House/apartment Stairs: Yes: Internal: 12 steps; on right going up and External: 3 steps; bilateral but cannot reach both Has following equipment at home: None  OCCUPATION: Retired   PLOF: Independent  PATIENT GOALS: Pt would like to eliminate pain and be able to return to walking without onset of familiar pain.   NEXT MD VISIT: 5/22  OBJECTIVE:   DIAGNOSTIC FINDINGS:  There is no evidence of hip fracture or dislocation. There is no evidence of arthropathy or other focal bone abnormality.  PATIENT SURVEYS:  FOTO 69.07%, 76% predicted.   SCREENING FOR RED FLAGS: Bowel or bladder incontinence: Yes: frequency   COGNITION: Overall cognitive status: Within functional limits for tasks assessed   SENSATION: WFL  MUSCLE LENGTH: Hamstrings: Right 75% deg; Left 75% deg  POSTURE: No Significant postural limitations  PALPATION: Tenderness isolated to L piriformis.   LUMBAR ROM:   AROM eval  Flexion WFL (tight hamstrings)   Extension WFL  Right lateral flexion WFL  Left lateral  flexion WFL  Right rotation WFL (symptoms present at end range)   Left rotation WFL   (Blank rows = not tested)  LOWER EXTREMITY ROM:     Active  Right eval Left eval  Hip flexion [facility]  Hip external rotation WFL 85%   Knee flexion [facility] WFL  Knee extension WFL WFL   (Blank rows = not tested)  LOWER EXTREMITY MMT:    MMT Right eval Left eval  Hip flexion 5/5 5/5  Hip extension    Hip abduction 4/5 4-/5  Knee flexion 5/5 5/5  Knee extension 5/5 5/5   (Blank rows = not tested)  LUMBAR SPECIAL TESTS:  Straight leg raise test: Negative and Slump test: Negative  FUNCTIONAL TESTS:  5 times sit to stand: 10.99 sec   GAIT: Distance walked: 75ft Assistive device utilized: None Level of assistance: Complete Independence Comments: No significant gait deviations.   TODAY'S TREATMENT:                                                                                                                              DATE: 10/17/22 Recumbent bike L5 X 6 min Standing hip flexor stretch 30 sec X 3 bilat Standing hip abduction X 20 bilat green band Standing hip extension X 20 bilat green band Standing hip march x 20 bilat green band Hip hike 2X10 bilat Manual therapy for skilled palpation and active compression with  Trigger  Point Dry-Needling  Treatment instructions: Expect mild to moderate muscle soreness. Patient Consent Given: Yes Education handout provided: ves provided Muscles treated: Left sided for: glutes and piriformis, TFL, hip flexor Treatment response/outcome: good overall tolerance,twitch response noted  Supine piriformis stretch 30 sec X 2 knee to opposite shoulder and 30 sec X 2 figure 4 Supine hamstring stretch 3 X 30 sec with strap Supine bridges Single leg X 15 bilat holding 5 sec Standing lumbar extension X 10 holding 5 sec  10/10/22 Recumbent bike L5 X 6 min Standing hip abduction X 15 bilat green band Standing hip extension X 15 bilat green band Standing hip  march x 15 bilat green band Manual therapy for skilled palpation and active compression with  Trigger Point Dry-Needling  Treatment instructions: Expect mild to moderate muscle soreness. Patient Consent Given: Yes Education handout provided: ves provided Muscles treated: Left sided for: Lumbar paraspinals and multifidi, glutes and piriformis, TFL, hip flexor Treatment response/outcome: good overall tolerance,twitch response noted  Supine hip flexor/quad stretch with strap 20 sec X 3 Supine piriformis stretch 30 sec X 3 knee to opposite shoulder and 30 sec X 3 figure 4 Supine hamstring stretch 3 X 30 sec with strap     PATIENT EDUCATION:  Education details: Educated pt on anatomy and physiology of current symptoms, FOTO, diagnosis, prognosis, HEP,  and POC. Person educated: Patient Education method: Medical illustrator Education comprehension: verbalized understanding and returned demonstration  HOME EXERCISE PROGRAM: Access Code: [email or web address] URL: [email or web address] Date: 10/17/2022 Prepared by: Ivery Quale  Exercises - Standing Lumbar Extension  - 2 x daily - 6 x weekly - 1-2 sets - 10 reps - 5 hold - Standing Hip Hiking  - 2 x daily - 6 x weekly - 2 sets - 10 reps - Figure 4 Bridge  - 2 x daily - 6 x weekly - 1-2 sets - 10 reps - Supine Lower Trunk Rotation  - 2 x daily - 7 x weekly - 1 sets - 10 reps - Supine Piriformis Stretch with Foot on Ground  - 2 x daily - 7 x weekly - 1 sets - 2-3 reps - 30 hold - Hooklying Hamstring Stretch with Strap  - 2 x daily - 7 x weekly - 1 sets - 2-3 reps - 30 hold - Supine Quadriceps Stretch with Strap on Table  - 2 x daily - 6 x weekly - 1 sets - 2-3 reps - 20 hold  ASSESSMENT:  CLINICAL IMPRESSION: He continues to report deep hip pain that is intermittent and generally after sitting for a while and he goes to straighten up. I did provide him with repeated lumbar extension stretch as well as additional hip exercises to  see if this will help these symptoms any.   OBJECTIVE IMPAIRMENTS: decreased activity tolerance, difficulty walking, decreased balance, decreased endurance, decreased mobility, decreased ROM, decreased strength, impaired flexibility, impaired UE/LE use, postural dysfunction, and pain.  ACTIVITY LIMITATIONS: bending, lifting, carry, locomotion, cleaning, community activity, driving, and or occupation  PERSONAL FACTORS: DDD, GERD, OA (bilat hands) are also affecting patient's functional outcome.  REHAB POTENTIAL: Good  CLINICAL DECISION MAKING: Stable/uncomplicated  EVALUATION COMPLEXITY: Low    GOALS: Short term PT Goals Target date: 10/10/2022  Pt will be I and compliant with HEP. Baseline:  Goal status: New Pt will decrease pain by 25% overall Baseline: Goal status: New  Long term PT goals Target date: 11/21/2022  Pt will improve L hip ER ROM to [facility] to improve functional  mobility Baseline: Goal status: New Pt will improve L hip abductor strength to at least 4+/5 MMT to improve functional strength Baseline: Goal status: New Pt will improve FOTO to at least 76% functional to show improved function Baseline: Goal status: New Pt will reduce pain by overall 50% overall with usual walking, getting in/ out of car/ chair.  Baseline: Goal status: New Pt will reduce pain to overall less than 2-3/10 with usual activity and work activity. Baseline: Goal status: New  PLAN: PT FREQUENCY: 1x times per week   PT DURATION: 6-8 weeks  PLANNED INTERVENTIONS (unless contraindicated): aquatic PT, Canalith repositioning, cryotherapy, Electrical stimulation, Iontophoresis with 4 mg/ml dexamethasome, Moist heat, traction, Ultrasound, gait training, Therapeutic exercise, balance training, neuromuscular re-education, patient/family education, prosthetic training, manual techniques, passive ROM, dry needling, taping, vasopnuematic device, vestibular, spinal manipulations, joint  manipulations  PLAN FOR NEXT SESSION: DN if helpful. Will need MD progress note next visit  April Manson, PT,DPT 10/17/2022, 10:12 AM  Referring diagnosis? Chronic left-sided low back pain with left-sided sciatica [M54.42, G89.29]  Treatment diagnosis? (if different than referring diagnosis) Other low back pain, Chronic left-sided low back pain with left-sided sciatica, muscle weakness  What was this (referring dx) caused by? []  Surgery []  Fall [x]  Ongoing issue []  Arthritis []  Other: ____________  Laterality: []  Rt [x]  Lt []  Both  Check all possible CPT codes:  *CHOOSE 10 OR LESS*    [x]  97110 (Therapeutic Exercise)  []  92507 (SLP Treatment)  [x]  97112 (Neuro Re-ed)   []  92526 (Swallowing Treatment)   [x]  97116 (Gait Training)   []  [id number] (Cognitive Training, 1st 15 minutes) [x]  97140 (Manual Therapy)   []  97130 (Cognitive Training, each add'l 15 minutes)  [x]  97164 (Re-evaluation)                              []  Other, List CPT Code ____________  [x]  97530 (Therapeutic Activities)     [x]  97535 (Self Care)   []  All codes above (97110 - 97535)  [x]  97012 (Mechanical Traction)  [x]  97014 (E-stim Unattended)  []  97032 (E-stim manual)  []  97033 (Ionto)  []  97035 (Ultrasound) []  97750 (Physical Performance Training) []  [id number] (Aquatic Therapy) [x]  97016 (Vasopneumatic Device) []  [id number] (Paraffin) []  97034 (Contrast Bath) []  97597 (Wound Care 1st 20 sq cm) []  97598 (Wound Care each add'l 20 sq cm) []  97760 (Orthotic Fabrication, Fitting, Training Initial) []  [id number] (Prosthetic Management and Training Initial) []  [id number] (Orthotic or Prosthetic Training/ Modification Subsequent)d

## 2022-10-24 ENCOUNTER — Encounter: Payer: Self-pay | Admitting: Physical Therapy

## 2022-10-24 ENCOUNTER — Ambulatory Visit: Payer: Medicare HMO | Admitting: Physical Therapy

## 2022-10-24 DIAGNOSIS — M6281 Muscle weakness (generalized): Secondary | ICD-10-CM

## 2022-10-24 DIAGNOSIS — M5459 Other low back pain: Secondary | ICD-10-CM

## 2022-10-24 DIAGNOSIS — G8929 Other chronic pain: Secondary | ICD-10-CM

## 2022-10-24 DIAGNOSIS — M5442 Lumbago with sciatica, left side: Secondary | ICD-10-CM

## 2022-10-24 NOTE — Therapy (Addendum)
OUTPATIENT PHYSICAL THERAPY TREATMENT  / DISCHARGE   Patient Name: David Dillon MRN: [id number] DOB:1951/04/07, 72 y.o., male Today's Date: 10/24/2022  END OF SESSION:  PT End of Session - 10/24/22 0912     Visit Number 5    Number of Visits 8    Date for PT Re-Evaluation 11/21/22    Authorization Type [employer]IC[employer]    Progress Note Due on Visit 10    PT Start Time 0912    PT Stop Time 0955    PT Time Calculation (min) 43 min    Activity Tolerance Patient tolerated treatment well    Behavior During Therapy WFL for tasks assessed/performed              Past Medical History:  Diagnosis Date   DDD (degenerative disc disease), lumbosacral    Dental bridge present    lower   Environmental allergies    pollen, grass, trees   GERD (gastroesophageal reflux disease)    History of asthma    > 30 years ago   Immature cataract of both eyes    OA (osteoarthritis)    bilateral hand   Osteochondral lesion of talar dome 08/2017   right   Sinus headache    Urinary frequency    Past Surgical History:  Procedure Laterality Date   ANKLE ARTHROSCOPY WITH DRILLING/MICROFRACTURE Right 08/23/2017   Procedure: Ankle Arthroscopy with Extensive Debridement and Grafting of the Osteochondral Lesions of Talus;  Surgeon: Toni Arthurs, MD;  Location: [facility];  Service: Orthopedics;  Laterality: Right;   COLONOSCOPY  03/2002   DENTAL SURGERY     VASECTOMY     Patient Active Problem List   Diagnosis Date Noted   Right ankle pain 05/02/2016    PCP: Ceasar Lund, PA  REFERRING PROVIDER: Kathryne Hitch, MD  REFERRING DIAG: Chronic left-sided low back pain with left-sided sciatica [M54.42, G89.29]   Rationale for Evaluation and Treatment: Rehabilitation  THERAPY DIAG:  Muscle weakness (generalized)  Other low back pain  Chronic left-sided low back pain with left-sided sciatica  ONSET DATE: a few months ago   SUBJECTIVE:                                                                                                                                                                                            SUBJECTIVE STATEMENT: He relays the posterior hip/buttock pain he was having has improved overall but he still has the same deep hip pain after sitting for a while and this pain has not really improved with PT PERTINENT HISTORY:  DDD, GERD, OA (bilat hands).  PAIN:  Are you having pain? Yes: NPRS scale: 0 at rest today coming in, can still get deep hip pain with activity/10 Pain location: L hip  Pain description: Sharp pain in L hip Aggravating factors: Getting in/ out of chair/ car, walking.  Relieving factors: Ice, tylenol  PRECAUTIONS: None  WEIGHT BEARING RESTRICTIONS: No  FALLS:  Has patient fallen in last 6 months? No  LIVING ENVIRONMENT: Lives with: lives with their family Lives in: House/apartment Stairs: Yes: Internal: 12 steps; on right going up and External: 3 steps; bilateral but cannot reach both Has following equipment at home: None  OCCUPATION: Retired   PLOF: Independent  PATIENT GOALS: Pt would like to eliminate pain and be able to return to walking without onset of familiar pain.   NEXT MD VISIT: 5/22  OBJECTIVE:   DIAGNOSTIC FINDINGS:  There is no evidence of hip fracture or dislocation. There is no evidence of arthropathy or other focal bone abnormality.  PATIENT SURVEYS:  Eval: FOTO 69.07%, 76% predicted.  10/24/22 FOTO 72.4%  SCREENING FOR RED FLAGS: Bowel or bladder incontinence: Yes: frequency   COGNITION: Overall cognitive status: Within functional limits for tasks assessed   SENSATION: WFL  MUSCLE LENGTH: Hamstrings: Right 75% deg; Left 75% deg  POSTURE: No Significant postural limitations  PALPATION: Tenderness isolated to L piriformis.   LUMBAR ROM:   AROM eval 10/24/22  Flexion WFL (tight hamstrings)  WFL but can't get all the way to floor, tight hamstrings   Extension WFL   Right lateral flexion WFL   Left lateral flexion WFL   Right rotation WFL (symptoms present at end range)    Left rotation WFL    (Blank rows = not tested)  LOWER EXTREMITY ROM:     Active  Right eval Left eval Left/Right 10/24/22  Hip flexion [facility]   Hip external rotation WFL 85%  30 deg/40 deg  Knee flexion [facility] WFL   Knee extension WFL WFL    (Blank rows = not tested)  LOWER EXTREMITY MMT:    MMT Right eval Left eval Left 10/24/22  Hip flexion 5/5 5/5   Hip extension     Hip abduction 4/5 4-/5 5/5  Knee flexion 5/5 5/5   Knee extension 5/5 5/5    (Blank rows = not tested)  LUMBAR SPECIAL TESTS:  Straight leg raise test: Negative and Slump test: Negative  FUNCTIONAL TESTS:  5 times sit to stand: 10.99 sec   GAIT: Distance walked: 53ft Assistive device utilized: None Level of assistance: Complete Independence Comments: No significant gait deviations.   TODAY'S TREATMENT:                                                                                                                              DATE: 10/24/22 Mechanical lumbar traction X 20 minutes 80-75# Updated measurements and goals Updated FOTO survey PT plan of care recommendations and education  10/17/22 Recumbent bike L5 X 6  min Standing hip flexor stretch 30 sec X 3 bilat Standing hip abduction X 20 bilat green band Standing hip extension X 20 bilat green band Standing hip march x 20 bilat green band Hip hike 2X10 bilat Manual therapy for skilled palpation and active compression with  Trigger Point Dry-Needling  Treatment instructions: Expect mild to moderate muscle soreness. Patient Consent Given: Yes Education handout provided: ves provided Muscles treated: Left sided for: glutes and piriformis, TFL, hip flexor Treatment response/outcome: good overall tolerance,twitch response noted  Supine piriformis stretch 30 sec X 2 knee to opposite shoulder and 30 sec X 2 figure 4 Supine  hamstring stretch 3 X 30 sec with strap Supine bridges Single leg X 15 bilat holding 5 sec Standing lumbar extension X 10 holding 5 sec  10/10/22 Recumbent bike L5 X 6 min Standing hip abduction X 15 bilat green band Standing hip extension X 15 bilat green band Standing hip march x 15 bilat green band Manual therapy for skilled palpation and active compression with  Trigger Point Dry-Needling  Treatment instructions: Expect mild to moderate muscle soreness. Patient Consent Given: Yes Education handout provided: ves provided Muscles treated: Left sided for: Lumbar paraspinals and multifidi, glutes and piriformis, TFL, hip flexor Treatment response/outcome: good overall tolerance,twitch response noted  Supine hip flexor/quad stretch with strap 20 sec X 3 Supine piriformis stretch 30 sec X 3 knee to opposite shoulder and 30 sec X 3 figure 4 Supine hamstring stretch 3 X 30 sec with strap     PATIENT EDUCATION:  Education details: Educated pt on anatomy and physiology of current symptoms, FOTO, diagnosis, prognosis, HEP,  and POC. Person educated: Patient Education method: Medical illustrator Education comprehension: verbalized understanding and returned demonstration  HOME EXERCISE PROGRAM: Access Code: [email or web address] URL: [email or web address] Date: 10/17/2022 Prepared by: Ivery Quale  Exercises - Standing Lumbar Extension  - 2 x daily - 6 x weekly - 1-2 sets - 10 reps - 5 hold - Standing Hip Hiking  - 2 x daily - 6 x weekly - 2 sets - 10 reps - Figure 4 Bridge  - 2 x daily - 6 x weekly - 1-2 sets - 10 reps - Supine Lower Trunk Rotation  - 2 x daily - 7 x weekly - 1 sets - 10 reps - Supine Piriformis Stretch with Foot on Ground  - 2 x daily - 7 x weekly - 1 sets - 2-3 reps - 30 hold - Hooklying Hamstring Stretch with Strap  - 2 x daily - 7 x weekly - 1 sets - 2-3 reps - 30 hold - Supine Quadriceps Stretch with Strap on Table  - 2 x daily - 6 x weekly - 1 sets -  2-3 reps - 20 hold  ASSESSMENT:  CLINICAL IMPRESSION: He has now completed 5 PT visits. He has made progress with his hip ROM and strength and posterior hip pain however he continues to report deep hip pain that is intermittent and generally after sitting for a while and he goes to straighten up. This pain has not improved with PT so I would recommend further diagnostic testing such as MRI as this is presenting as more of an intraarticular hip problem than lumbar radiculopathy.  We did try lumbar traction today to see if this helps any of the symptoms any. DN has helped with the pain in the glutes but again has not changed his deep hip pain.  OBJECTIVE IMPAIRMENTS: decreased activity tolerance, difficulty walking, decreased balance, decreased endurance, decreased  mobility, decreased ROM, decreased strength, impaired flexibility, impaired UE/LE use, postural dysfunction, and pain.  ACTIVITY LIMITATIONS: bending, lifting, carry, locomotion, cleaning, community activity, driving, and or occupation  PERSONAL FACTORS: DDD, GERD, OA (bilat hands) are also affecting patient's functional outcome.  REHAB POTENTIAL: Good  CLINICAL DECISION MAKING: Stable/uncomplicated  EVALUATION COMPLEXITY: Low    GOALS: Short term PT Goals Target date: 10/10/2022  Pt will be I and compliant with HEP. Baseline:  Goal status: MET 10/17/22 Pt will decrease pain by 25% overall Baseline: Goal status: MET 10/17/22  Long term PT goals Target date: 11/21/2022  Pt will improve L hip ER ROM to [facility] to improve functional mobility Baseline: Goal status: ongoing, still some limitations compare to other side 10/24/22 Pt will improve L hip abductor strength to at least 4+/5 MMT to improve functional strength Baseline: Goal status: MET 10/24/22 Pt will improve FOTO to at least 76% functional to show improved function Baseline: Goal status: ongoing, improved to 74% on 10/24/22 Pt will reduce pain by overall 50% overall with  usual walking, getting in/ out of car/ chair.  Baseline: Goal status:ongoing, has not improved with getting in/out of chair 10/24/22 Pt will reduce pain to overall less than 2-3/10 with usual activity and work activity. Baseline: Goal status:ongoing 10/24/22  PLAN: PT FREQUENCY: 1x times per week   PT DURATION: 6-8 weeks  PLANNED INTERVENTIONS (unless contraindicated): aquatic PT, Canalith repositioning, cryotherapy, Electrical stimulation, Iontophoresis with 4 mg/ml dexamethasome, Moist heat, traction, Ultrasound, gait training, Therapeutic exercise, balance training, neuromuscular re-education, patient/family education, prosthetic training, manual techniques, passive ROM, dry needling, taping, vasopnuematic device, vestibular, spinal manipulations, joint manipulations  PLAN FOR NEXT SESSION: how was traction from last time, hip ER flexability and hamstring stretching, DN if desired, what did MD say? April Manson, PT,DPT 10/24/2022, 9:13 AM   PHYSICAL THERAPY DISCHARGE SUMMARY  Visits from Start of Care: 5  Current functional level related to goals / functional outcomes: See note   Remaining deficits: See note   Education / Equipment: HEP  Patient goals were partially met. Patient is being discharged due to not returning since the last visit.   Chyrel Masson, PT, DPT, OCS, ATC 11/29/22  3:48 PM

## 2022-10-25 ENCOUNTER — Ambulatory Visit: Payer: Medicare HMO | Admitting: Orthopaedic Surgery

## 2022-10-25 ENCOUNTER — Other Ambulatory Visit: Payer: Self-pay

## 2022-10-25 ENCOUNTER — Encounter: Payer: Self-pay | Admitting: Orthopaedic Surgery

## 2022-10-25 DIAGNOSIS — M25552 Pain in left hip: Secondary | ICD-10-CM

## 2022-10-25 NOTE — Progress Notes (Signed)
The patient is an active and young appearing 72 year old who is here for follow-up after having physical therapy for left lower back pain muscle pain but also he continues to have groin pain in his left hip.  Therapy has helped with everything except for the hip.  The hip hurts with certain motions and is definitely in the groin where he feels this.  I was able to see previous plain films of his pelvis and hips and they were normal-appearing.  A CT scan that was done of his abdomen and pelvis back in October of last year did not show any significant arthritic changes in his hips.  On exam his left hip moves smoothly and fluidly but he does have some pain in the groin on the extremes of rotation.  At this point given the failure of conservative treatment including physical therapy, we will order a MRI arthrogram of the left hip to evaluate the cartilage and the labrum.  Will see him back once we have the study.  He agrees with this treatment plan.  All questions and concerns were addressed and answered.

## 2022-10-31 ENCOUNTER — Encounter: Payer: Medicare HMO | Admitting: Physical Therapy

## 2022-11-01 DIAGNOSIS — Z86010 Personal history of colonic polyps: Secondary | ICD-10-CM | POA: Diagnosis not present

## 2022-11-01 DIAGNOSIS — K648 Other hemorrhoids: Secondary | ICD-10-CM | POA: Diagnosis not present

## 2022-11-01 DIAGNOSIS — D175 Benign lipomatous neoplasm of intra-abdominal organs: Secondary | ICD-10-CM | POA: Diagnosis not present

## 2022-11-01 DIAGNOSIS — Z09 Encounter for follow-up examination after completed treatment for conditions other than malignant neoplasm: Secondary | ICD-10-CM | POA: Diagnosis not present

## 2022-11-01 DIAGNOSIS — D122 Benign neoplasm of ascending colon: Secondary | ICD-10-CM | POA: Diagnosis not present

## 2022-11-03 DIAGNOSIS — D122 Benign neoplasm of ascending colon: Secondary | ICD-10-CM | POA: Diagnosis not present

## 2022-11-21 ENCOUNTER — Ambulatory Visit
Admission: RE | Admit: 2022-11-21 | Discharge: 2022-11-21 | Disposition: A | Discharge status: Home / Self Care | Payer: Medicare HMO | Source: Ambulatory Visit | Attending: Orthopaedic Surgery | Admitting: Orthopaedic Surgery

## 2022-11-21 DIAGNOSIS — M25552 Pain in left hip: Secondary | ICD-10-CM

## 2022-11-21 DIAGNOSIS — M1612 Unilateral primary osteoarthritis, left hip: Secondary | ICD-10-CM | POA: Diagnosis not present

## 2022-11-21 DIAGNOSIS — M24152 Other articular cartilage disorders, left hip: Secondary | ICD-10-CM | POA: Diagnosis not present

## 2022-11-21 MED ORDER — IOPAMIDOL (ISOVUE-M 200) INJECTION 41%
12.0000 mL | Freq: Once | INTRAMUSCULAR | Status: AC
  Administered 2022-11-21: 12 mL via INTRA_ARTICULAR

## 2022-11-27 ENCOUNTER — Ambulatory Visit: Payer: Medicare HMO | Admitting: Orthopaedic Surgery

## 2022-11-27 ENCOUNTER — Encounter: Payer: Self-pay | Admitting: Orthopaedic Surgery

## 2022-11-27 DIAGNOSIS — M1612 Unilateral primary osteoarthritis, left hip: Secondary | ICD-10-CM | POA: Diagnosis not present

## 2022-11-27 NOTE — Progress Notes (Signed)
The patient comes in today to go over an MRI of his left hip.  He has been hurting a bit more after the MRI.  Pivoting activities cause groin pain as well.  His plain films did not show any significant arthritic changes in the hip so we sent her for an MRI after the failure conservative treatment.  He is an active and young appearing 72 years old.  He does not walk with assistive device either.  This pain to be getting worse for over a year now.  On exam again there is pain in the groin on the left side with extremes of internal and external rotation.  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  At this point we did discuss hip replacement surgery.  He is aware somewhat of what the surgery involves since I did this on his wife.  I gave him and about hip replacement surgery and showed him a hip replacement model.  We discussed the risks and benefits of surgery and what to expect from an intraoperative and postoperative course.  I was able to review all of his medications and medical history within epic.  All question concerns were answered and addressed.  He is interested in and getting on the schedule for a left total hip arthroplasty.

## 2022-12-05 DIAGNOSIS — B009 Herpesviral infection, unspecified: Secondary | ICD-10-CM | POA: Diagnosis not present

## 2022-12-05 DIAGNOSIS — D1801 Hemangioma of skin and subcutaneous tissue: Secondary | ICD-10-CM | POA: Diagnosis not present

## 2022-12-05 DIAGNOSIS — L820 Inflamed seborrheic keratosis: Secondary | ICD-10-CM | POA: Diagnosis not present

## 2022-12-05 DIAGNOSIS — D485 Neoplasm of uncertain behavior of skin: Secondary | ICD-10-CM | POA: Diagnosis not present

## 2022-12-05 DIAGNOSIS — L814 Other melanin hyperpigmentation: Secondary | ICD-10-CM | POA: Diagnosis not present

## 2022-12-05 DIAGNOSIS — L821 Other seborrheic keratosis: Secondary | ICD-10-CM | POA: Diagnosis not present

## 2022-12-05 DIAGNOSIS — L578 Other skin changes due to chronic exposure to nonionizing radiation: Secondary | ICD-10-CM | POA: Diagnosis not present

## 2022-12-05 DIAGNOSIS — C44319 Basal cell carcinoma of skin of other parts of face: Secondary | ICD-10-CM | POA: Diagnosis not present

## 2022-12-08 DIAGNOSIS — R197 Diarrhea, unspecified: Secondary | ICD-10-CM | POA: Diagnosis not present

## 2022-12-12 DIAGNOSIS — C44319 Basal cell carcinoma of skin of other parts of face: Secondary | ICD-10-CM | POA: Diagnosis not present

## 2023-01-04 ENCOUNTER — Other Ambulatory Visit: Payer: Self-pay

## 2023-01-05 NOTE — Patient Instructions (Signed)
SURGICAL WAITING ROOM VISITATION  Patients having surgery or a procedure may have no more than 2 support people in the waiting area - these visitors may rotate.    Children under the age of 67 must have an adult with them who is not the patient.  Due to an increase in RSV and influenza rates and associated hospitalizations, children ages 9 and under may not visit patients in [facility] hospitals.  If the patient needs to stay at the hospital during part of their recovery, the visitor guidelines for inpatient rooms apply. Pre-op nurse will coordinate an appropriate time for 1 support person to accompany patient in pre-op.  This support person may not rotate.    Please refer to the [facility] website for the visitor guidelines for Inpatients (after your surgery is over and you are in a regular room).    Your procedure is scheduled on: 01/19/23   Report to [facility] Main Entrance    Report to admitting at 10:30 AM   Call this number if you have problems the morning of surgery [phone]   Do not eat food :After Midnight.   After Midnight you may have the following liquids until 10:00 AM DAY OF SURGERY  Water Non-Citrus Juices (without pulp, NO RED-Apple, White grape, White cranberry) Black Coffee (NO MILK/CREAM OR CREAMERS, sugar ok)  Clear Tea (NO MILK/CREAM OR CREAMERS, sugar ok) regular and decaf                             Plain Jell-O (NO RED)                                           Fruit ices (not with fruit pulp, NO RED)                                     Popsicles (NO RED)                                                               Sports drinks like Gatorade (NO RED)    The day of surgery:  Drink ONE (1) Pre-Surgery Clear Ensure at 10:00 AM the morning of surgery. Drink in one sitting. Do not sip.  This drink was given to you during your hospital  pre-op appointment visit. Nothing else to drink after completing the  Pre-Surgery Clear Ensure.           If you have questions, please contact your surgeon's office.   FOLLOW BOWEL PREP AND ANY ADDITIONAL PRE OP INSTRUCTIONS YOU RECEIVED FROM YOUR SURGEON'S OFFICE!!!     Oral Hygiene is also important to reduce your risk of infection.                                    Remember - BRUSH YOUR TEETH THE MORNING OF SURGERY WITH YOUR REGULAR TOOTHPASTE  DENTURES WILL BE REMOVED PRIOR TO SURGERY PLEASE DO NOT APPLY "Poly grip" OR ADHESIVES!!!  Stop all vitamins and herbal supplements 7 days before surgery.   Take these medicines the morning of surgery with A SIP OF WATER: Tylenol, Zyrtec, Famotidine, Flonase             You may not have any metal on your body including jewelry, and body piercing             Do not wear lotions, powders, cologne, or deodorant              Men may shave face and neck.   Do not bring valuables to the hospital. [name]             RESPONSIBLE   FOR VALUABLES.   Contacts, glasses, dentures or bridgework may not be worn into surgery.   Bring small overnight bag day of surgery.   DO NOT BRING YOUR HOME MEDICATIONS TO THE HOSPITAL. PHARMACY WILL DISPENSE MEDICATIONS LISTED ON YOUR MEDICATION LIST TO YOU DURING YOUR ADMISSION IN THE HOSPITAL!   Special Instructions: Bring a copy of your healthcare power of attorney and living will documents the day of surgery if you haven't scanned them before.              Please read over the following fact sheets you were given: IF YOU HAVE QUESTIONS ABOUT YOUR PRE-OP INSTRUCTIONS PLEASE CALL [name]    If you received a COVID test during your pre-op visit  it is requested that you wear a mask when out in public, stay away from anyone that may not be feeling well and notify your surgeon if you develop symptoms. If you test positive for Covid or have been in contact with anyone that has tested positive in the last 10 days please notify you surgeon.      Pre-operative 5 CHG Bath Instructions   You  can play a key role in reducing the risk of infection after surgery. Your skin needs to be as free of germs as possible. You can reduce the number of germs on your skin by washing with CHG (chlorhexidine gluconate) soap before surgery. CHG is an antiseptic soap that kills germs and continues to kill germs even after washing.   DO NOT use if you have an allergy to chlorhexidine/CHG or antibacterial soaps. If your skin becomes reddened or irritated, stop using the CHG and notify one of our RNs at [phone].   Please shower with the CHG soap starting 4 days before surgery using the following schedule:     Please keep in mind the following:  DO NOT shave, including legs and underarms, starting the day of your first shower.   You may shave your face at any point before/day of surgery.  Place clean sheets on your bed the day you start using CHG soap. Use a clean washcloth (not used since being washed) for each shower. DO NOT sleep with pets once you start using the CHG.   CHG Shower Instructions:  If you choose to wash your hair and private area, wash first with your normal shampoo/soap.  After you use shampoo/soap, rinse your hair and body thoroughly to remove shampoo/soap residue.  Turn the water OFF and apply about 3 tablespoons (45 ml) of CHG soap to a CLEAN washcloth.  Apply CHG soap ONLY FROM YOUR NECK DOWN TO YOUR TOES (washing for 3-5 minutes)  DO NOT use CHG soap on face, private areas, open wounds, or sores.  Pay special attention to the area where your surgery  is being performed.  If you are having back surgery, having someone wash your back for you may be helpful. Wait 2 minutes after CHG soap is applied, then you may rinse off the CHG soap.  Pat dry with a clean towel  Put on clean clothes/pajamas   If you choose to wear lotion, please use ONLY the CHG-compatible lotions on the back of this paper.     Additional instructions for the day of surgery: DO NOT APPLY any lotions,  deodorants, cologne, or perfumes.   Put on clean/comfortable clothes.  Brush your teeth.  Ask your nurse before applying any prescription medications to the skin.      CHG Compatible Lotions   Aveeno Moisturizing lotion  Cetaphil Moisturizing Cream  Cetaphil Moisturizing Lotion  Clairol Herbal Essence Moisturizing Lotion, Dry Skin  Clairol Herbal Essence Moisturizing Lotion, Extra Dry Skin  Clairol Herbal Essence Moisturizing Lotion, Normal Skin  Curel Age Defying Therapeutic Moisturizing Lotion with Alpha Hydroxy  Curel Extreme Care Body Lotion  Curel Soothing Hands Moisturizing Hand Lotion  Curel Therapeutic Moisturizing Cream, Fragrance-Free  Curel Therapeutic Moisturizing Lotion, Fragrance-Free  Curel Therapeutic Moisturizing Lotion, Original Formula  Eucerin Daily Replenishing Lotion  Eucerin Dry Skin Therapy Plus Alpha Hydroxy Crme  Eucerin Dry Skin Therapy Plus Alpha Hydroxy Lotion  Eucerin Original Crme  Eucerin Original Lotion  Eucerin Plus Crme Eucerin Plus Lotion  Eucerin TriLipid Replenishing Lotion  Keri Anti-Bacterial Hand Lotion  Keri Deep Conditioning Original Lotion Dry Skin Formula Softly Scented  Keri Deep Conditioning Original Lotion, Fragrance Free Sensitive Skin Formula  Keri Lotion Fast Absorbing Fragrance Free Sensitive Skin Formula  Keri Lotion Fast Absorbing Softly Scented Dry Skin Formula  Keri Original Lotion  Keri Skin Renewal Lotion Keri Silky Smooth Lotion  Keri Silky Smooth Sensitive Skin Lotion  Nivea Body Creamy Conditioning Oil  Nivea Body Extra Enriched Teacher, adult education Moisturizing Lotion Nivea Crme  Nivea Skin Firming Lotion  NutraDerm 30 Skin Lotion  NutraDerm Skin Lotion  NutraDerm Therapeutic Skin Cream  NutraDerm Therapeutic Skin Lotion  ProShield Protective Hand Cream  Provon moisturizing lotion  WHAT IS A BLOOD TRANSFUSION? Blood Transfusion Information  A transfusion is the  replacement of blood or some of its parts. Blood is made up of multiple cells which provide different functions. Red blood cells carry oxygen and are used for blood loss replacement. White blood cells fight against infection. Platelets control bleeding. Plasma helps clot blood. Other blood products are available for specialized needs, such as hemophilia or other clotting disorders. BEFORE THE TRANSFUSION  Who gives blood for transfusions?  Healthy volunteers who are fully evaluated to make sure their blood is safe. This is blood bank blood. Transfusion therapy is the safest it has ever been in the practice of medicine. Before blood is taken from a donor, a complete history is taken to make sure that person has no history of diseases nor engages in risky social behavior (examples are intravenous drug use or sexual activity with multiple partners). The donor's travel history is screened to minimize risk of transmitting infections, such as malaria. The donated blood is tested for signs of infectious diseases, such as HIV and hepatitis. The blood is then tested to be sure it is compatible with you in order to minimize the chance of a transfusion reaction. If you or a relative donates blood, this is often done in anticipation of surgery and is not appropriate for emergency situations. It takes many  days to process the donated blood. RISKS AND COMPLICATIONS Although transfusion therapy is very safe and saves many lives, the main dangers of transfusion include:  Getting an infectious disease. Developing a transfusion reaction. This is an allergic reaction to something in the blood you were given. Every precaution is taken to prevent this. The decision to have a blood transfusion has been considered carefully by your caregiver before blood is given. Blood is not given unless the benefits outweigh the risks. AFTER THE TRANSFUSION Right after receiving a blood transfusion, you will usually feel much better and  more energetic. This is especially true if your red blood cells have gotten low (anemic). The transfusion raises the level of the red blood cells which carry oxygen, and this usually causes an energy increase. The nurse administering the transfusion will monitor you carefully for complications. HOME CARE INSTRUCTIONS  No special instructions are needed after a transfusion. You may find your energy is better. Speak with your caregiver about any limitations on activity for underlying diseases you may have. SEEK MEDICAL CARE IF:  Your condition is not improving after your transfusion. You develop redness or irritation at the intravenous (IV) site. SEEK IMMEDIATE MEDICAL CARE IF:  Any of the following symptoms occur over the next 12 hours: Shaking chills. You have a temperature by mouth above 102 F (38.9 C), not controlled by medicine. Chest, back, or muscle pain. People around you feel you are not acting correctly or are confused. Shortness of breath or difficulty breathing. Dizziness and fainting. You get a rash or develop hives. You have a decrease in urine output. Your urine turns a dark color or changes to pink, red, or brown. Any of the following symptoms occur over the next 10 days: You have a temperature by mouth above 102 F (38.9 C), not controlled by medicine. Shortness of breath. Weakness after normal activity. The white part of the eye turns yellow (jaundice). You have a decrease in the amount of urine or are urinating less often. Your urine turns a dark color or changes to pink, red, or brown. Document Released: 05/19/2000 Document Revised: 08/14/2011 Document Reviewed: 01/06/2008 [employer] Patient Information 2014 [place], [place].  _______________________________________________________________________  Incentive Spirometer  An incentive spirometer is a tool that can help keep your lungs clear and active. This tool measures how well you are filling your lungs with each  breath. Taking long deep breaths may help reverse or decrease the chance of developing breathing (pulmonary) problems (especially infection) following: A long period of time when you are unable to move or be active. BEFORE THE PROCEDURE  If the spirometer includes an indicator to show your best effort, your nurse or respiratory therapist will set it to a desired goal. If possible, sit up straight or lean slightly forward. Try not to slouch. Hold the incentive spirometer in an upright position. INSTRUCTIONS FOR USE  Sit on the edge of your bed if possible, or sit up as far as you can in bed or on a chair. Hold the incentive spirometer in an upright position. Breathe out normally. Place the mouthpiece in your mouth and seal your lips tightly around it. Breathe in slowly and as deeply as possible, raising the piston or the ball toward the top of the column. Hold your breath for 3-5 seconds or for as long as possible. Allow the piston or ball to fall to the bottom of the column. Remove the mouthpiece from your mouth and breathe out normally. Rest for a few seconds  and repeat Steps 1 through 7 at least 10 times every 1-2 hours when you are awake. Take your time and take a few normal breaths between deep breaths. The spirometer may include an indicator to show your best effort. Use the indicator as a goal to work toward during each repetition. After each set of 10 deep breaths, practice coughing to be sure your lungs are clear. If you have an incision (the cut made at the time of surgery), support your incision when coughing by placing a pillow or rolled up towels firmly against it. Once you are able to get out of bed, walk around indoors and cough well. You may stop using the incentive spirometer when instructed by your caregiver.  RISKS AND COMPLICATIONS Take your time so you do not get dizzy or light-headed. If you are in pain, you may need to take or ask for pain medication before doing incentive  spirometry. It is harder to take a deep breath if you are having pain. AFTER USE Rest and breathe slowly and easily. It can be helpful to keep track of a log of your progress. Your caregiver can provide you with a simple table to help with this. If you are using the spirometer at home, follow these instructions: SEEK MEDICAL CARE IF:  You are having difficultly using the spirometer. You have trouble using the spirometer as often as instructed. Your pain medication is not giving enough relief while using the spirometer. You develop fever of 100.5 F (38.1 C) or higher. SEEK IMMEDIATE MEDICAL CARE IF:  You cough up bloody sputum that had not been present before. You develop fever of 102 F (38.9 C) or greater. You develop worsening pain at or near the incision site. MAKE SURE YOU:  Understand these instructions. Will watch your condition. Will get help right away if you are not doing well or get worse. Document Released: 10/02/2006 Document Revised: 08/14/2011 Document Reviewed: 12/03/2006 [facility] Patient Information 2014 [place], [place].   ________________________________________________________________________

## 2023-01-05 NOTE — Progress Notes (Signed)
COVID Vaccine Completed: yes  Date of COVID positive in last 90 days:  PCP - Sanda Klein, PA Cardiologist -   Chest x-ray -  EKG -  Stress Test -  ECHO -  Cardiac Cath -  Pacemaker/ICD device last checked: Spinal Cord Stimulator:  Bowel Prep -   Sleep Study -  CPAP -   Fasting Blood Sugar -  Checks Blood Sugar _____ times a day  Last dose of GLP1 agonist-  N/A GLP1 instructions:  N/A   Last dose of SGLT-2 inhibitors-  N/A SGLT-2 instructions: N/A   Blood Thinner Instructions:  Time Aspirin Instructions: Last Dose:  Activity level:  Can go up a flight of stairs and perform activities of daily living without stopping and without symptoms of chest pain or shortness of breath.  Able to exercise without symptoms  Unable to go up a flight of stairs without symptoms of     Anesthesia review:   Patient denies shortness of breath, fever, cough and chest pain at PAT appointment  Patient verbalized understanding of instructions that were given to them at the PAT appointment. Patient was also instructed that they will need to review over the PAT instructions again at home before surgery.

## 2023-01-08 ENCOUNTER — Encounter (HOSPITAL_COMMUNITY)
Admission: RE | Admit: 2023-01-08 | Discharge: 2023-01-08 | Disposition: A | Discharge status: Home / Self Care | Payer: Medicare HMO | Source: Ambulatory Visit | Attending: Orthopaedic Surgery | Admitting: Orthopaedic Surgery

## 2023-01-08 ENCOUNTER — Other Ambulatory Visit: Payer: Self-pay

## 2023-01-08 ENCOUNTER — Encounter (HOSPITAL_COMMUNITY): Payer: Self-pay

## 2023-01-08 VITALS — BP 142/85 | HR 70 | Temp 98.4°F | Resp 14 | Ht 70.0 in | Wt 193.0 lb

## 2023-01-08 DIAGNOSIS — M1612 Unilateral primary osteoarthritis, left hip: Secondary | ICD-10-CM | POA: Insufficient documentation

## 2023-01-08 DIAGNOSIS — Z01818 Encounter for other preprocedural examination: Secondary | ICD-10-CM | POA: Diagnosis not present

## 2023-01-08 HISTORY — DX: Pneumonia, unspecified organism: J18.9

## 2023-01-08 HISTORY — DX: Malignant (primary) neoplasm, unspecified: C80.1

## 2023-01-08 LAB — TYPE AND SCREEN
ABO/RH(D): A POS
Antibody Screen: NEGATIVE

## 2023-01-08 LAB — CBC
HCT: 45.9 % (ref 39.0–52.0)
Hemoglobin: 15.2 g/dL (ref 13.0–17.0)
MCH: 30.3 pg (ref 26.0–34.0)
MCHC: 33.1 g/dL (ref 30.0–36.0)
MCV: 91.6 fL (ref 80.0–100.0)
Platelets: 229 10*3/uL (ref 150–400)
RBC: 5.01 MIL/uL (ref 4.22–5.81)
RDW: 13.2 % (ref 11.5–15.5)
WBC: 7.5 10*3/uL (ref 4.0–10.5)
nRBC: 0 % (ref 0.0–0.2)

## 2023-01-08 LAB — COMPREHENSIVE METABOLIC PANEL
ALT: 34 U/L (ref 0–44)
AST: 28 U/L (ref 15–41)
Albumin: 4.4 g/dL (ref 3.5–5.0)
Alkaline Phosphatase: 46 U/L (ref 38–126)
Anion gap: 11 (ref 5–15)
BUN: 10 mg/dL (ref 8–23)
CO2: 23 mmol/L (ref 22–32)
Calcium: 9.4 mg/dL (ref 8.9–10.3)
Chloride: 103 mmol/L (ref 98–111)
Creatinine, Ser: 0.83 mg/dL (ref 0.61–1.24)
GFR, Estimated: >60 mL/min (ref >60)
Glucose, Bld: 92 mg/dL (ref 70–99)
Potassium: 4.3 mmol/L (ref 3.5–5.1)
Sodium: 137 mmol/L (ref 135–145)
Total Bilirubin: 0.5 mg/dL (ref 0.3–1.2)
Total Protein: 7.6 g/dL (ref 6.5–8.1)

## 2023-01-08 LAB — SURGICAL PCR SCREEN
MRSA, PCR: NEGATIVE
Staphylococcus aureus: NEGATIVE

## 2023-01-18 ENCOUNTER — Telehealth: Payer: Self-pay | Admitting: *Deleted

## 2023-01-18 NOTE — Progress Notes (Signed)
Pt verbalizes understanding of 0830 arrival to [facility] 01/19/23 and stopping fluids by 0800.

## 2023-01-18 NOTE — Care Plan (Signed)
[facility] RNCM call to patient to discuss his upcoming Left total hip arthrplasty with Dr. Magnus Ivan on 01/19/23. Patient is an Ortho bundle and is agreeable to case management. He lives with his spouse and will have assistance from his wife and son. He has a lot of DME already in the home due to a daughter that requires these items. He has a stair lift, toilet hydraulic lifts, rollators, and a cane. He does not have a standard walker however and will need this prior to discharge. Anticipate HHPT will be needed after a short hospital stay. Referral made to [facility], who has already reached out to him. Reviewed all post op care instructions. Will continue to follow for needs.

## 2023-01-18 NOTE — H&P (Signed)
TOTAL HIP ADMISSION H&P  Patient is admitted for left total hip arthroplasty.  Subjective:  Chief Complaint: left hip pain  HPI: David Dillon, 72 y.o. male, has a history of pain and functional disability in the left hip(s) due to arthritis and patient has failed non-surgical conservative treatments for greater than 12 weeks to include NSAID's and/or analgesics and activity modification.  Onset of symptoms was gradual starting 1 years ago with gradually worsening course since that time.The patient noted no past surgery on the left hip(s).  Patient currently rates pain in the left hip at 10 out of 10 with activity. Patient has night pain, worsening of pain with activity and weight bearing, pain that interfers with activities of daily living, and pain with passive range of motion. Patient has evidence of  labral tearing and cartilage loss on MRI of the left hip  by imaging studies. This condition presents safety issues increasing the risk of falls. There is no current active infection.  Patient Active Problem List   Diagnosis Date Noted   Unilateral primary osteoarthritis, left hip 11/27/2022   Right ankle pain 05/02/2016   Past Medical History:  Diagnosis Date   Cancer (HCC)    basal cell   DDD (degenerative disc disease), lumbosacral    Dental bridge present    lower   Environmental allergies    pollen, grass, trees   GERD (gastroesophageal reflux disease)    History of asthma    > 30 years ago   Immature cataract of both eyes    OA (osteoarthritis)    bilateral hand   Osteochondral lesion of talar dome 08/2017   right   Pneumonia    Sinus headache    Urinary frequency     Past Surgical History:  Procedure Laterality Date   ANKLE ARTHROSCOPY WITH DRILLING/MICROFRACTURE Right 08/23/2017   Procedure: Ankle Arthroscopy with Extensive Debridement and Grafting of the Osteochondral Lesions of Talus;  Surgeon: Toni Arthurs, MD;  Location: [facility];  Service:  Orthopedics;  Laterality: Right;   COLONOSCOPY  03/2002   DENTAL SURGERY     VASECTOMY      No current facility-administered medications for this encounter.   Current Outpatient Medications  Medication Sig Dispense Refill Last Dose   acetaminophen (TYLENOL) 500 MG tablet Take 1,000 mg by mouth every 6 (six) hours as needed for moderate pain.      cetirizine (ZYRTEC) 10 MG tablet Take 10 mg by mouth daily as needed for allergies.      famotidine (PEPCID) 20 MG tablet Take 20 mg by mouth daily as needed for heartburn or indigestion.      fluticasone (FLONASE) 50 MCG/ACT nasal spray Place 2 sprays into both nostrils daily as needed for allergies or rhinitis.      hydrocortisone (ANUSOL-HC) 2.5 % rectal cream Place 1 Application rectally 2 (two) times daily as needed for hemorrhoids or anal itching.      Multiple Vitamins-Minerals (MENS 50+ MULTI VITAMIN/MIN) TABS Take 1 tablet by mouth daily.      naproxen sodium (ALEVE) 220 MG tablet Take 220 mg by mouth daily as needed (pain).      psyllium (METAMUCIL) 58.6 % packet Take 1 packet by mouth daily as needed (constipation).      valACYclovir (VALTREX) 500 MG tablet Take 500 mg by mouth 2 (two) times daily as needed.      dicyclomine (BENTYL) 20 MG tablet Take 1 tablet (20 mg total) by mouth 2 (two) times  daily. (Patient not taking: Reported on 01/05/2023) 20 tablet 0 Not Taking   docusate sodium (COLACE) 100 MG capsule Take 1 capsule (100 mg total) by mouth 2 (two) times daily. While taking narcotic pain medicine. (Patient not taking: Reported on 01/05/2023) 30 capsule 0 Not Taking   oxyCODONE (ROXICODONE) 5 MG immediate release tablet Take 1 tablet (5 mg total) by mouth every 4 (four) hours as needed for moderate pain or severe pain. For no more than 5 days. (Patient not taking: Reported on 01/05/2023) 30 tablet 0 Not Taking   oxyCODONE-acetaminophen (PERCOCET/ROXICET) 5-325 MG tablet Take 1 tablet by mouth every 6 (six) hours as needed for severe pain.  (Patient not taking: Reported on 01/05/2023) 8 tablet 0 Not Taking   pantoprazole (PROTONIX) 20 MG tablet Take 1 tablet (20 mg total) by mouth 2 (two) times daily before a meal. (Patient not taking: Reported on 01/05/2023) 30 tablet 0 Not Taking   senna (SENOKOT) 8.6 MG TABS tablet Take 2 tablets (17.2 mg total) by mouth 2 (two) times daily. (Patient not taking: Reported on 01/05/2023) 30 each 0 Not Taking   sucralfate (CARAFATE) 1 g tablet Take 1 tablet (1 g total) by mouth 4 (four) times daily. (Patient not taking: Reported on 01/05/2023) 30 tablet 0 Not Taking   No Known Allergies  Social History   Tobacco Use   Smoking status: Former    Current packs/day: 0.00    Types: Cigarettes    Quit date: 06/05/1983    Years since quitting: 39.6   Smokeless tobacco: Never  Substance Use Topics   Alcohol use: Yes    Comment: 1-2 beers/day    Family History  Problem Relation Age of Onset   Diabetes Mother    Breast cancer Mother    Heart attack Father 58   Breast cancer Sister    CVA Maternal Grandmother 52   Colon cancer Maternal Uncle    Melanoma Daughter      Review of Systems  Objective:  Physical Exam Vitals reviewed.  Constitutional:      Appearance: Normal appearance. He is normal weight.  HENT:     Head: Normocephalic and atraumatic.  Eyes:     Extraocular Movements: Extraocular movements intact.     Pupils: Pupils are equal, round, and reactive to light.  Cardiovascular:     Rate and Rhythm: Normal rate and regular rhythm.     Pulses: Normal pulses.  Pulmonary:     Effort: Pulmonary effort is normal.     Breath sounds: Normal breath sounds.  Abdominal:     Palpations: Abdomen is soft.  Musculoskeletal:     Cervical back: Normal range of motion and neck supple.     Left hip: Tenderness and bony tenderness present.  Neurological:     Mental Status: He is alert and oriented to person, place, and time.     Vital signs in last 24 hours:    Labs:   Estimated body  mass index is 27.69 kg/m as calculated from the following:   Height as of 01/08/23: 5\' 10"  (1.778 m).   Weight as of 01/08/23: 87.5 kg.   Imaging Review MRI  demonstrates severe degenerative joint disease of the left hip(s). The bone quality appears to be excellent for age and reported activity level.      Assessment/Plan:  End stage arthritis, left hip(s)  The patient history, physical examination, clinical judgement of the provider and imaging studies are consistent with end stage degenerative joint disease  of the left hip(s) and total hip arthroplasty is deemed medically necessary. The treatment options including medical management, injection therapy, arthroscopy and arthroplasty were discussed at length. The risks and benefits of total hip arthroplasty were presented and reviewed. The risks due to aseptic loosening, infection, stiffness, dislocation/subluxation,  thromboembolic complications and other imponderables were discussed.  The patient acknowledged the explanation, agreed to proceed with the plan and consent was signed. Patient is being admitted for inpatient treatment for surgery, pain control, PT, OT, prophylactic antibiotics, VTE prophylaxis, progressive ambulation and ADL's and discharge planning.The patient is planning to be discharged home with home health services

## 2023-01-18 NOTE — Telephone Encounter (Signed)
Ortho bundle pre-op call completed. 

## 2023-01-18 NOTE — Telephone Encounter (Signed)
Ortho bundle pre-op call attempted. No answer and left VM requesting call back.

## 2023-01-19 ENCOUNTER — Encounter (HOSPITAL_COMMUNITY): Payer: Self-pay | Admitting: Orthopaedic Surgery

## 2023-01-19 ENCOUNTER — Observation Stay (HOSPITAL_COMMUNITY): Admission: RE | Admit: 2023-01-19 | Payer: Medicare HMO | Source: Home / Self Care | Admitting: Orthopaedic Surgery

## 2023-01-19 ENCOUNTER — Other Ambulatory Visit: Payer: Self-pay

## 2023-01-19 ENCOUNTER — Ambulatory Visit (HOSPITAL_COMMUNITY): Payer: Medicare HMO

## 2023-01-19 ENCOUNTER — Ambulatory Visit (HOSPITAL_COMMUNITY): Payer: Medicare HMO | Admitting: Anesthesiology

## 2023-01-19 ENCOUNTER — Ambulatory Visit (HOSPITAL_BASED_OUTPATIENT_CLINIC_OR_DEPARTMENT_OTHER): Payer: Medicare HMO | Admitting: Anesthesiology

## 2023-01-19 ENCOUNTER — Observation Stay (HOSPITAL_COMMUNITY): Payer: Medicare HMO

## 2023-01-19 ENCOUNTER — Encounter (HOSPITAL_COMMUNITY)
Admission: RE | Disposition: A | Discharge status: Home Health | Payer: Self-pay | Source: Home / Self Care | Attending: Orthopaedic Surgery

## 2023-01-19 DIAGNOSIS — Z96642 Presence of left artificial hip joint: Secondary | ICD-10-CM

## 2023-01-19 DIAGNOSIS — Z01818 Encounter for other preprocedural examination: Secondary | ICD-10-CM

## 2023-01-19 DIAGNOSIS — M1612 Unilateral primary osteoarthritis, left hip: Secondary | ICD-10-CM

## 2023-01-19 DIAGNOSIS — Z471 Aftercare following joint replacement surgery: Secondary | ICD-10-CM | POA: Diagnosis not present

## 2023-01-19 DIAGNOSIS — Z79899 Other long term (current) drug therapy: Secondary | ICD-10-CM | POA: Insufficient documentation

## 2023-01-19 DIAGNOSIS — Z87891 Personal history of nicotine dependence: Secondary | ICD-10-CM | POA: Insufficient documentation

## 2023-01-19 DIAGNOSIS — Z85828 Personal history of other malignant neoplasm of skin: Secondary | ICD-10-CM | POA: Diagnosis not present

## 2023-01-19 DIAGNOSIS — J45909 Unspecified asthma, uncomplicated: Secondary | ICD-10-CM | POA: Diagnosis not present

## 2023-01-19 HISTORY — PX: TOTAL HIP ARTHROPLASTY: SHX124

## 2023-01-19 LAB — ABO/RH: ABO/RH(D): A POS

## 2023-01-19 SURGERY — ARTHROPLASTY, HIP, TOTAL, ANTERIOR APPROACH
Anesthesia: Spinal | Site: Hip | Laterality: Left

## 2023-01-19 MED ORDER — POVIDONE-IODINE 10 % EX SWAB: 2.0000 | Freq: Once | CUTANEOUS | Status: DC

## 2023-01-19 MED ORDER — SODIUM CHLORIDE 0.9 % IR SOLN
Status: DC | PRN
  Administered 2023-01-19: 1000 mL

## 2023-01-19 MED ORDER — DOCUSATE SODIUM 100 MG PO CAPS
100.0000 mg | ORAL_CAPSULE | Freq: Two times a day (BID) | ORAL | Status: DC
  Administered 2023-01-19 – 2023-01-20 (×2): 100 mg via ORAL
  Filled 2023-01-19 (×2): qty 1

## 2023-01-19 MED ORDER — CEFAZOLIN SODIUM-DEXTROSE 2-4 GM/100ML-% IV SOLN
2.0000 g | INTRAVENOUS | Status: AC
  Administered 2023-01-19: 2 g via INTRAVENOUS
  Filled 2023-01-19: qty 100

## 2023-01-19 MED ORDER — PROPOFOL 1000 MG/100ML IV EMUL
INTRAVENOUS | Status: AC
  Filled 2023-01-19: qty 100

## 2023-01-19 MED ORDER — METHOCARBAMOL 500 MG IVPB - SIMPLE MED: 500.0000 mg | Freq: Four times a day (QID) | INTRAVENOUS | Status: DC | PRN

## 2023-01-19 MED ORDER — METHOCARBAMOL 500 MG PO TABS
500.0000 mg | ORAL_TABLET | Freq: Four times a day (QID) | ORAL | Status: DC | PRN
  Administered 2023-01-19 – 2023-01-20 (×3): 500 mg via ORAL
  Filled 2023-01-19 (×3): qty 1

## 2023-01-19 MED ORDER — PHENOL 1.4 % MT LIQD: 1.0000 | OROMUCOSAL | Status: DC | PRN

## 2023-01-19 MED ORDER — CHLORHEXIDINE GLUCONATE 0.12 % MT SOLN
15.0000 mL | Freq: Once | OROMUCOSAL | Status: AC
  Administered 2023-01-19: 15 mL via OROMUCOSAL

## 2023-01-19 MED ORDER — POLYETHYLENE GLYCOL 3350 17 G PO PACK: 17.0000 g | PACK | Freq: Every day | ORAL | Status: DC | PRN

## 2023-01-19 MED ORDER — PHENYLEPHRINE HCL-NACL 20-0.9 MG/250ML-% IV SOLN
INTRAVENOUS | Status: DC | PRN
  Administered 2023-01-19: 50 ug/min via INTRAVENOUS

## 2023-01-19 MED ORDER — 0.9 % SODIUM CHLORIDE (POUR BTL) OPTIME
TOPICAL | Status: DC | PRN
  Administered 2023-01-19: 1000 mL

## 2023-01-19 MED ORDER — ACETAMINOPHEN 325 MG PO TABS: 325.0000 mg | ORAL_TABLET | Freq: Four times a day (QID) | ORAL | Status: DC | PRN

## 2023-01-19 MED ORDER — ASPIRIN 81 MG PO CHEW
81.0000 mg | CHEWABLE_TABLET | Freq: Two times a day (BID) | ORAL | Status: DC
  Administered 2023-01-19 – 2023-01-20 (×2): 81 mg via ORAL
  Filled 2023-01-19 (×2): qty 1

## 2023-01-19 MED ORDER — ONDANSETRON HCL 4 MG PO TABS: 4.0000 mg | ORAL_TABLET | Freq: Four times a day (QID) | ORAL | Status: DC | PRN

## 2023-01-19 MED ORDER — LACTATED RINGERS IV SOLN: INTRAVENOUS | Status: DC

## 2023-01-19 MED ORDER — ONDANSETRON HCL 4 MG/2ML IJ SOLN
INTRAMUSCULAR | Status: AC
  Filled 2023-01-19: qty 2

## 2023-01-19 MED ORDER — ACETAMINOPHEN 500 MG PO TABS
1000.0000 mg | ORAL_TABLET | Freq: Once | ORAL | Status: AC
  Administered 2023-01-19: 1000 mg via ORAL
  Filled 2023-01-19: qty 2

## 2023-01-19 MED ORDER — ALUM & MAG HYDROXIDE-SIMETH 200-200-20 MG/5ML PO SUSP: 30.0000 mL | ORAL | Status: DC | PRN

## 2023-01-19 MED ORDER — CEFAZOLIN SODIUM-DEXTROSE 1-4 GM/50ML-% IV SOLN
1.0000 g | Freq: Four times a day (QID) | INTRAVENOUS | Status: AC
  Administered 2023-01-19 (×2): 1 g via INTRAVENOUS
  Filled 2023-01-19 (×2): qty 50

## 2023-01-19 MED ORDER — HYDROMORPHONE HCL 1 MG/ML IJ SOLN
0.5000 mg | INTRAMUSCULAR | Status: DC | PRN
  Administered 2023-01-19: 1 mg via INTRAVENOUS
  Filled 2023-01-19: qty 1

## 2023-01-19 MED ORDER — DEXAMETHASONE SODIUM PHOSPHATE 10 MG/ML IJ SOLN
INTRAMUSCULAR | Status: AC
  Filled 2023-01-19: qty 1

## 2023-01-19 MED ORDER — METOCLOPRAMIDE HCL 5 MG PO TABS: 5.0000 mg | ORAL_TABLET | Freq: Three times a day (TID) | ORAL | Status: DC | PRN

## 2023-01-19 MED ORDER — ONDANSETRON HCL 4 MG/2ML IJ SOLN: 4.0000 mg | Freq: Four times a day (QID) | INTRAMUSCULAR | Status: DC | PRN

## 2023-01-19 MED ORDER — STERILE WATER FOR IRRIGATION IR SOLN
Status: DC | PRN
  Administered 2023-01-19: 2000 mL

## 2023-01-19 MED ORDER — MIDAZOLAM HCL 5 MG/5ML IJ SOLN
INTRAMUSCULAR | Status: DC | PRN
  Administered 2023-01-19: 2 mg via INTRAVENOUS

## 2023-01-19 MED ORDER — FENTANYL CITRATE (PF) 100 MCG/2ML IJ SOLN
INTRAMUSCULAR | Status: DC | PRN
  Administered 2023-01-19: 100 ug via INTRAVENOUS

## 2023-01-19 MED ORDER — FENTANYL CITRATE (PF) 100 MCG/2ML IJ SOLN
INTRAMUSCULAR | Status: AC
  Filled 2023-01-19: qty 2

## 2023-01-19 MED ORDER — TRANEXAMIC ACID-NACL 1000-0.7 MG/100ML-% IV SOLN
1000.0000 mg | INTRAVENOUS | Status: AC
  Administered 2023-01-19: 1000 mg via INTRAVENOUS
  Filled 2023-01-19: qty 100

## 2023-01-19 MED ORDER — ORAL CARE MOUTH RINSE: 15.0000 mL | Freq: Once | OROMUCOSAL | Status: AC

## 2023-01-19 MED ORDER — FENTANYL CITRATE PF 50 MCG/ML IJ SOSY: 25.0000 ug | PREFILLED_SYRINGE | INTRAMUSCULAR | Status: DC | PRN

## 2023-01-19 MED ORDER — ORAL CARE MOUTH RINSE: 15.0000 mL | OROMUCOSAL | Status: DC | PRN

## 2023-01-19 MED ORDER — DIPHENHYDRAMINE HCL 12.5 MG/5ML PO ELIX: 12.5000 mg | ORAL_SOLUTION | ORAL | Status: DC | PRN

## 2023-01-19 MED ORDER — BUPIVACAINE IN DEXTROSE 0.75-8.25 % IT SOLN
INTRATHECAL | Status: DC | PRN
Start: 2023-01-19 — End: 2023-01-19
  Administered 2023-01-19: 1.8 mL via INTRATHECAL

## 2023-01-19 MED ORDER — PROPOFOL 500 MG/50ML IV EMUL
INTRAVENOUS | Status: DC | PRN
  Administered 2023-01-19: 50 ug/kg/min via INTRAVENOUS

## 2023-01-19 MED ORDER — PROPOFOL 10 MG/ML IV BOLUS
INTRAVENOUS | Status: AC
  Filled 2023-01-19: qty 20

## 2023-01-19 MED ORDER — PROPOFOL 10 MG/ML IV BOLUS
INTRAVENOUS | Status: DC | PRN
Start: 2023-01-19 — End: 2023-01-19
  Administered 2023-01-19: 30 mg via INTRAVENOUS
  Administered 2023-01-19 (×2): 20 mg via INTRAVENOUS

## 2023-01-19 MED ORDER — DEXAMETHASONE SODIUM PHOSPHATE 10 MG/ML IJ SOLN
INTRAMUSCULAR | Status: DC | PRN
  Administered 2023-01-19: 10 mg via INTRAVENOUS

## 2023-01-19 MED ORDER — METOCLOPRAMIDE HCL 5 MG/ML IJ SOLN: 5.0000 mg | Freq: Three times a day (TID) | INTRAMUSCULAR | Status: DC | PRN

## 2023-01-19 MED ORDER — OXYCODONE HCL 5 MG PO TABS
10.0000 mg | ORAL_TABLET | ORAL | Status: DC | PRN
  Administered 2023-01-19: 15 mg via ORAL
  Filled 2023-01-19 (×2): qty 3

## 2023-01-19 MED ORDER — SODIUM CHLORIDE 0.9 % IV SOLN: INTRAVENOUS | Status: DC

## 2023-01-19 MED ORDER — MIDAZOLAM HCL 2 MG/2ML IJ SOLN
INTRAMUSCULAR | Status: AC
  Filled 2023-01-19: qty 2

## 2023-01-19 MED ORDER — MENTHOL 3 MG MT LOZG: 1.0000 | LOZENGE | OROMUCOSAL | Status: DC | PRN

## 2023-01-19 MED ORDER — PANTOPRAZOLE SODIUM 40 MG PO TBEC
40.0000 mg | DELAYED_RELEASE_TABLET | Freq: Every day | ORAL | Status: DC
  Administered 2023-01-19: 40 mg via ORAL
  Filled 2023-01-19: qty 1

## 2023-01-19 MED ORDER — OXYCODONE HCL 5 MG PO TABS
5.0000 mg | ORAL_TABLET | ORAL | Status: DC | PRN
  Administered 2023-01-19 – 2023-01-20 (×3): 10 mg via ORAL
  Filled 2023-01-19 (×2): qty 2

## 2023-01-19 SURGICAL SUPPLY — 42 items
ACETAB CUP W/GRIPTION 54 (Plate) ×1 IMPLANT
APL SKNCLS STERI-STRIP NONHPOA (GAUZE/BANDAGES/DRESSINGS)
BAG COUNTER SPONGE SURGICOUNT (BAG) ×1 IMPLANT
BAG SPEC THK2 15X12 ZIP CLS (MISCELLANEOUS)
BAG SPNG CNTER NS LX DISP (BAG) ×1
BAG ZIPLOCK 12X15 (MISCELLANEOUS) IMPLANT
BENZOIN TINCTURE PRP APPL 2/3 (GAUZE/BANDAGES/DRESSINGS) IMPLANT
BLADE SAW SGTL 18X1.27X75 (BLADE) ×1 IMPLANT
COVER PERINEAL POST (MISCELLANEOUS) ×1 IMPLANT
COVER SURGICAL LIGHT HANDLE (MISCELLANEOUS) ×1 IMPLANT
CUP ACETAB W/GRIPTION 54 (Plate) IMPLANT
DRAPE FOOT SWITCH (DRAPES) ×1 IMPLANT
DRAPE STERI IOBAN 125X83 (DRAPES) ×1 IMPLANT
DRAPE U-SHAPE 47X51 STRL (DRAPES) ×2 IMPLANT
DRSG AQUACEL AG ADV 3.5X10 (GAUZE/BANDAGES/DRESSINGS) ×1 IMPLANT
DURAPREP 26ML APPLICATOR (WOUND CARE) ×1 IMPLANT
ELECT REM PT RETURN 15FT ADLT (MISCELLANEOUS) ×1 IMPLANT
GAUZE XEROFORM 1X8 LF (GAUZE/BANDAGES/DRESSINGS) IMPLANT
GLOVE BIO SURGEON STRL SZ7.5 (GLOVE) ×1 IMPLANT
GLOVE BIOGEL PI IND STRL 8 (GLOVE) ×2 IMPLANT
GLOVE ECLIPSE 8.0 STRL XLNG CF (GLOVE) ×1 IMPLANT
GOWN STRL REUS W/ TWL XL LVL3 (GOWN DISPOSABLE) ×2 IMPLANT
GOWN STRL REUS W/TWL XL LVL3 (GOWN DISPOSABLE) ×2
HANDPIECE INTERPULSE COAX TIP (DISPOSABLE) ×1
HEAD CERAMIC DELTA 36 PLUS 1.5 (Hips) IMPLANT
HOLDER FOLEY CATH W/STRAP (MISCELLANEOUS) ×1 IMPLANT
KIT TURNOVER KIT A (KITS) IMPLANT
LINER NEUTRAL 36ID 54OD (Liner) IMPLANT
PACK ANTERIOR HIP CUSTOM (KITS) ×1 IMPLANT
SET HNDPC FAN SPRY TIP SCT (DISPOSABLE) ×1 IMPLANT
STAPLER VISISTAT 35W (STAPLE) IMPLANT
STEM FEM ACTIS HIGH SZ3 (Stem) IMPLANT
STRIP CLOSURE SKIN 1/2X4 (GAUZE/BANDAGES/DRESSINGS) IMPLANT
SUT ETHIBOND NAB CT1 #1 30IN (SUTURE) ×1 IMPLANT
SUT ETHILON 2 0 PS N (SUTURE) IMPLANT
SUT MNCRL AB 4-0 PS2 18 (SUTURE) IMPLANT
SUT VIC AB 0 CT1 36 (SUTURE) ×1 IMPLANT
SUT VIC AB 1 CT1 36 (SUTURE) ×1 IMPLANT
SUT VIC AB 2-0 CT1 27 (SUTURE) ×2
SUT VIC AB 2-0 CT1 TAPERPNT 27 (SUTURE) ×2 IMPLANT
TRAY FOLEY MTR SLVR 16FR STAT (SET/KITS/TRAYS/PACK) IMPLANT
YANKAUER SUCT BULB TIP NO VENT (SUCTIONS) ×1 IMPLANT

## 2023-01-19 NOTE — Anesthesia Postprocedure Evaluation (Signed)
Anesthesia Post Note  Patient: David Dillon  Procedure(s) Performed: LEFT TOTAL HIP ARTHROPLASTY ANTERIOR APPROACH (Left: Hip)     Patient location during evaluation: PACU Anesthesia Type: Spinal Level of consciousness: oriented and awake and alert Pain management: pain level controlled Vital Signs Assessment: post-procedure vital signs reviewed and stable Respiratory status: spontaneous breathing, respiratory function stable and patient connected to nasal cannula oxygen Cardiovascular status: blood pressure returned to baseline and stable Postop Assessment: no headache, no backache and no apparent nausea or vomiting Anesthetic complications: no  No notable events documented.  Last Vitals:  Vitals:   01/19/23 1415 01/19/23 1437  BP: 114/75 124/70  Pulse: (!) 55 (!) 53  Resp: 16 16  Temp: (!) 36.3 C (!) 36.4 C  SpO2: 99% 98%    Last Pain:  Vitals:   01/19/23 1415  TempSrc:   PainSc: 0-No pain                 Argel Pablo L Coti Burd

## 2023-01-19 NOTE — Interval H&P Note (Signed)
History and Physical Interval Note: The patient understands that he is here today for a left hip replacement to treat his significant left hip pain and arthritis.  There has been no acute or interval change in his medical status.  The risks and benefits of surgery been discussed in detail and informed consent has been obtained.  The left operative hip has been marked.  01/19/2023 10:01 AM  David Dillon  has presented today for surgery, with the diagnosis of osteoarthritis left hip.  The various methods of treatment have been discussed with the patient and family. After consideration of risks, benefits and other options for treatment, the patient has consented to  Procedure(s): LEFT TOTAL HIP ARTHROPLASTY ANTERIOR APPROACH (Left) as a surgical intervention.  The patient's history has been reviewed, patient examined, no change in status, stable for surgery.  I have reviewed the patient's chart and labs.  Questions were answered to the patient's satisfaction.     Kathryne Hitch

## 2023-01-19 NOTE — Transfer of Care (Signed)
Immediate Anesthesia Transfer of Care Note  Patient: David Dillon  Procedure(s) Performed: LEFT TOTAL HIP ARTHROPLASTY ANTERIOR APPROACH (Left: Hip)  Patient Location: PACU  Anesthesia Type:Spinal  Level of Consciousness: drowsy  Airway & Oxygen Therapy: Patient Spontanous Breathing and Patient connected to face mask oxygen  Post-op Assessment: Report given to RN and Post -op Vital signs reviewed and stable  Post vital signs: Reviewed and stable  Last Vitals:  Vitals Value Taken Time  BP 89/49 01/19/23 1247  Temp    Pulse 58 01/19/23 1249  Resp 15 01/19/23 1249  SpO2 100 % 01/19/23 1249  Vitals shown include unfiled device data.  Last Pain:  Vitals:   01/19/23 0931  TempSrc: Oral  PainSc:          Complications: No notable events documented.

## 2023-01-19 NOTE — Anesthesia Preprocedure Evaluation (Addendum)
Anesthesia Evaluation  Patient identified by MRN, date of birth, ID band Patient awake    Reviewed: Allergy & Precautions, NPO status , Patient's Chart, lab work & pertinent test results  Airway Mallampati: III  TM Distance: >3 FB Neck ROM: Full    Dental  (+) Dental Advisory Given, Chipped,    Pulmonary asthma , former smoker   Pulmonary exam normal breath sounds clear to auscultation       Cardiovascular negative cardio ROS Normal cardiovascular exam Rhythm:Regular Rate:Normal     Neuro/Psych  Headaches  negative psych ROS   GI/Hepatic Neg liver ROS,GERD  ,,  Endo/Other  negative endocrine ROS    Renal/GU negative Renal ROS  negative genitourinary   Musculoskeletal  (+) Arthritis ,    Abdominal   Peds  Hematology negative hematology ROS (+)   Anesthesia Other Findings   Reproductive/Obstetrics                             Anesthesia Physical Anesthesia Plan  ASA: 2  Anesthesia Plan: Spinal   Post-op Pain Management: Tylenol PO (pre-op)*   Induction:   PONV Risk Score and Plan: Treatment may vary due to age or medical condition, Midazolam, Dexamethasone and Ondansetron  Airway Management Planned: Natural Airway  Additional Equipment:   Intra-op Plan:   Post-operative Plan:   Informed Consent: I have reviewed the patients History and Physical, chart, labs and discussed the procedure including the risks, benefits and alternatives for the proposed anesthesia with the patient or authorized representative who has indicated his/her understanding and acceptance.     Dental advisory given  Plan Discussed with: CRNA  Anesthesia Plan Comments:        Anesthesia Quick Evaluation

## 2023-01-19 NOTE — Anesthesia Procedure Notes (Signed)
Date/Time: 01/19/2023 11:17 AM  Performed by: Florene Route, CRNAOxygen Delivery Method: Simple face mask

## 2023-01-19 NOTE — Anesthesia Procedure Notes (Signed)
Spinal  Patient location during procedure: OR Start time: 01/19/2023 11:19 AM End time: 01/19/2023 11:24 AM Reason for block: surgical anesthesia Staffing Performed: resident/CRNA  Resident/CRNA: Florene Route, CRNA Performed by: Florene Route, CRNA Authorized by: Elmer Picker, MD   Preanesthetic Checklist Completed: patient identified, IV checked, site marked, risks and benefits discussed, surgical consent, monitors and equipment checked, pre-op evaluation and timeout performed Spinal Block Patient position: sitting Prep: DuraPrep and site prepped and draped Patient monitoring: heart rate, continuous pulse ox and blood pressure Approach: midline Location: L3-4 Injection technique: single-shot Needle Needle type: Pencan  Needle gauge: 24 G Needle length: 10 cm Assessment Sensory level: T4 Events: CSF return Additional Notes Kit expiration  date 10/02/2024 and lot #[id number] Clear free flow CSF on 2nd attempt, negative heme, negative paresthesia Tolerated well and returned to supine position

## 2023-01-19 NOTE — Evaluation (Signed)
Physical Therapy Evaluation Patient Details Name: WILMONT LABARCA MRN: [id number] DOB: Oct 05, 1950 Today's Date: 01/19/2023  History of Present Illness  72 yo male presents to therapy s/p L THA, anterior approach on 01/19/2023 due to failure of conservative measures. Pt PMH includes but is not limited to: DDD, GERD, Asthma, and R ankle pain/osteochondral lesion of talar dome s/p arthroscopy (2019).  Clinical Impression    LEMICHAEL KONIG is a 72 y.o. male POD 0 s/p L THA, AA. Patient reports IND with mobility at baseline. Patient is now limited by functional impairments (see PT problem list below) and requires CGA for bed mobility and CGA and cues for transfers. Patient was able to ambulate 55 feet with RW and CGA level of assist. Patient instructed in exercise to facilitate ROM and circulation to manage edema. Patient will benefit from continued skilled PT interventions to address impairments and progress towards PLOF. Acute PT will follow to progress mobility and stair training in preparation for safe discharge home with family support and [facility] services.       If plan is discharge home, recommend the following: A little help with walking and/or transfers;A little help with bathing/dressing/bathroom;Assistance with cooking/housework;Assist for transportation;Help with stairs or ramp for entrance   Can travel by private vehicle        Equipment Recommendations Rolling walker (2 wheels)  Recommendations for Other Services       Functional Status Assessment Patient has had a recent decline in their functional status and demonstrates the ability to make significant improvements in function in a reasonable and predictable amount of time.     Precautions / Restrictions Precautions Precautions: Fall Restrictions Weight Bearing Restrictions: No      Mobility  Bed Mobility Overal bed mobility: Needs Assistance Bed Mobility: Supine to Sit     Supine to sit: Contact guard, HOB elevated,  Used rails     General bed mobility comments: min cues    Transfers Overall transfer level: Needs assistance Equipment used: Rolling walker (2 wheels) Transfers: Sit to/from Stand Sit to Stand: From elevated surface, Contact guard assist           General transfer comment: cues for proper UE placement    Ambulation/Gait Ambulation/Gait assistance: Contact guard assist Gait Distance (Feet): 55 Feet Assistive device: Rolling walker (2 wheels) Gait Pattern/deviations: Step-to pattern, Antalgic Gait velocity: decreased     General Gait Details: step almost through pattern, no reported increase in pain and  Stairs            Wheelchair Mobility     Tilt Bed    Modified Rankin (Stroke Patients Only)       Balance Overall balance assessment: Needs assistance Sitting-balance support: Feet supported Sitting balance-Leahy Scale: Good     Standing balance support: Bilateral upper extremity supported, During functional activity, Reliant on assistive device for balance Standing balance-Leahy Scale: Poor                               Pertinent Vitals/Pain Pain Assessment Pain Assessment: 0-10 Pain Score: 4  Pain Location: L hip Pain Descriptors / Indicators: Aching, Constant, Discomfort, Operative site guarding Pain Intervention(s): Limited activity within patient's tolerance, Monitored during session, Premedicated before session, Repositioned, Ice applied    Home Living Family/patient expects to be discharged to:: Private residence Living Arrangements: Spouse/significant other;Children Available Help at Discharge: Family Type of Home: House Home Access: Ramped entrance  Alternate Level Stairs-Number of Steps: stair lift/flight Home Layout: Two level;Bed/bath upstairs Home Equipment: Rollator (4 wheels);Cane - single point (hydrolic elevating commode seat) Additional Comments: pt reports son will be home for a few days to help and daughter  whom resides with pt and spouse has disability impacting IND with functional mobility    Prior Function Prior Level of Function : Independent/Modified Independent;Driving             Mobility Comments: IND no AD with all ADLs self care tasks, IADLs ADLs Comments: reports difficulty with lower body dressing and threading pants/undergarments     Extremity/Trunk Assessment        Lower Extremity Assessment Lower Extremity Assessment: LLE deficits/detail LLE Deficits / Details: ankle DF/PF 5/5 LLE Sensation: WNL    Cervical / Trunk Assessment Cervical / Trunk Assessment:  (wfl)  Communication   Communication Communication: No apparent difficulties  Cognition Arousal: Alert Behavior During Therapy: WFL for tasks assessed/performed Overall Cognitive Status: Within Functional Limits for tasks assessed                                          General Comments      Exercises Total Joint Exercises Ankle Circles/Pumps: AROM, Both, 20 reps   Assessment/Plan    PT Assessment Patient needs continued PT services  PT Problem List Decreased strength;Decreased range of motion;Decreased activity tolerance;Decreased balance;Decreased mobility;Decreased coordination;Pain       PT Treatment Interventions DME instruction;Gait training;Stair training;Functional mobility training;Therapeutic activities;Therapeutic exercise;Balance training;Neuromuscular re-education;Patient/family education;Modalities    PT Goals (Current goals can be found in the Care Plan section)  Acute Rehab PT Goals Patient Stated Goal: hiking in the park 3-4 miles 3/4 times a week PT Goal Formulation: With patient Time For Goal Achievement: 02/02/23 Potential to Achieve Goals: Good    Frequency 7X/week     Co-evaluation               AM-PAC PT "6 Clicks" Mobility  Outcome Measure Help needed turning from your back to your side while in a flat bed without using bedrails?: A  Little Help needed moving from lying on your back to sitting on the side of a flat bed without using bedrails?: A Little Help needed moving to and from a bed to a chair (including a wheelchair)?: A Little Help needed standing up from a chair using your arms (e.g., wheelchair or bedside chair)?: A Little Help needed to walk in hospital room?: A Little Help needed climbing 3-5 steps with a railing? : A Lot 6 Click Score: 17    End of Session Equipment Utilized During Treatment: Gait belt Activity Tolerance: Patient tolerated treatment well;No increased pain Patient left: in chair;with call bell/phone within reach;with chair alarm set Nurse Communication: Mobility status PT Visit Diagnosis: Unsteadiness on feet (R26.81);Other abnormalities of gait and mobility (R26.89);Muscle weakness (generalized) (M62.81);Difficulty in walking, not elsewhere classified (R26.2);Pain Pain - Right/Left: Left Pain - part of body: Hip;Leg    Time: 4696-2952 PT Time Calculation (min) (ACUTE ONLY): 24 min   Charges:   PT Evaluation $PT Eval Low Complexity: 1 Low PT Treatments $Gait Training: 8-22 mins PT General Charges $$ ACUTE PT VISIT: 1 Visit         Johnny Bridge, PT Acute Rehab   Jacqualyn Posey 01/19/2023, 5:55 PM

## 2023-01-19 NOTE — Op Note (Signed)
Operative Note  Date of operation: 01/19/2023 Preoperative diagnosis: Left hip primary osteoarthritis Postoperative diagnosis: Same  Procedures: Left direct anterior total hip arthroplasty  Implants: Implant Name Type Inv. Item Serial No. Manufacturer Lot No. LRB No. Used Action  ACETAB CUP W/GRIPTION 54 - [id number] Plate ACETAB CUP W/GRIPTION 54  [employer] [id number] Left 1 Implanted  LINER NEUTRAL 36ID 54OD - [id number] Liner LINER NEUTRAL 36ID 54OD  DEPU[employer]TH[employer] [id number] Left 1 Implanted  STEM FEM ACTIS HIGH SZ3 - [id number] Stem STEM FEM ACTIS HIGH SZ3  [employer]PU[employer]TH[employer] [id number] Left 1 Implanted  HEAD CERAMIC DELTA 36 PLUS 1.5 - [id number] Hips HEAD CERAMIC DELTA 36 PLUS 1.5  [employer] [id number] Left 1 Implanted   Surgeon: Vanita Panda. Magnus Ivan, MD Assistant: Rexene Edison, PA-C  Anesthesia: Spinal EBL: 200 cc Antibiotics: 2 g IV Ancef Complications: None  Indications: The patient is a 72 year old gentleman with worsening left hip pain.  His x-rays show only mild to moderate arthritis and this was confirmed on MRI findings but his pain has been consistent with his hip and he is tried and failed all forms of conservative treatment and at this point wishes to proceed with a hip replacement.  Talked about the risks of acute blood loss anemia, nerve vessel injury, fracture, infection, dislocation, DVT, implant failure, leg length differences and wound healing issues.  Due to the detrimental effect that his left hip is having on his mobility, his activity living and his quality of life we do agree with the proceed with the surgery.  He understands her goals are hopefully decrease pain, improve mobility, and improve quality of life.  Procedure description: After informed consent was obtained and the appropriate left hip was marked, the patient was brought to the operating room and set up on the stretcher where spinal anesthesia was obtained.  He was then laid in supine  position on stretcher and a Foley catheter is placed.  Traction boots were placed on both his feet and he was placed supine on the Hana fracture table with a perineal post in place in both legs and inline skeletal traction devices but no traction applied.  His left hip and pelvis were assessed radiographically and his left hip was prepped and draped with DuraPrep and sterile drapes.  A timeout was called and he was identified as good patient correct left hip.  An incision was made just inferior and posterior to the ASIS and carried slightly obliquely down the leg.  Dissection was carried down to the tensor fascia lata muscle and the tensor fascia was then divided longitudinally to proceed with a direct interposed the hip.  Circumflex vessels were identified and cauterized.  The hip capsule identified and opened Beltone format and we did find a mild to moderate effusion.  Cobra retractors were placed around the medial and lateral femoral neck and a femoral neck cut was placed just proximal to the lesser trochanter with an oscillating saw and this was completed with an osteotome.  A corkscrew guide was placed in the femoral head and the femoral head was removed in its entirety and there was a stable small area at the weightbearing surface with cartilage wear.  There was also noted to be tearing of the weightbearing surface of the labrum.  A bent Hohmann was placed over the medial acetabular rim and remnants of the labrum and other debris removed.  We then began reaming under direct visualization from a size 43 reamer and stepwise increments going to a size  53 reamer with all reamers placed under direct visualization and the last reamer was placed under direct fluoroscopy in order to obtain the depth of reaming, the inclination and the anteversion.  The real DePuy sector GRIPTION acetabular component size 54 was then placed without difficulty followed by placing a 36+0 polyethylene liner for that size 54 acetabular  component.  Attention was then turned to the femur.  With the left operative leg externally rotated to 120 degrees, extended and adducted, a Mueller retractor was placed medially and a Hohmann retractor was placed behind the greater trochanter.  The lateral joint capsule was released and a box cutting osteotome was used to enter the femoral canal.  Broaching was then initiated using the Actis broaching system from a size 0 going to a size 3.  With a size 3 in place we trialed a standard offset femoral neck and a 36-2 hip ball given a high neck cut.  The leg was brought over and up and with traction and internal rotation reduced in the pelvis.  We assessed it radiographically and clinically and we needed more offset and leg length.  We dislocated the hip remove the trial components.  We were pleased with a size 3 size of that femoral component so we placed the real size 3 but one with high offset and then the real 36+1.5 ceramic hip ball.  Again this was reduced and acetabulum we are pleased with leg length, offset, range of motion and stability assessed radiographically and clinically.  The soft tissue was then irrigated with normal saline solution.  The joint capsule was closed with interrupted #1 Ethibond suture followed by #1 Vicryl to close the tensor fascia.  0 Vicryl was used to close the deep tissue and 2-0 Vicryl was used to close the subcutaneous tissue.  The skin was closed with staples.  An Aquacel dressing was applied.  The patient was taken to the recovery room in stable condition.  Rexene Edison, PA-C did assist during the entire case from beginning to end and his assistance was medically necessary and crucial for soft tissue management and retraction, helping guide implant placement and a layered closure of the wound.

## 2023-01-19 NOTE — TOC Transition Note (Signed)
Transition of Care [facility]) - CM/SW Discharge Note  Patient Details  Name: David Dillon MRN: [id number] Date of Birth: [birth year]  Transition of Care [facility]) CM/SW Contact:  Ewing Schlein, LCSW Phone Number: 01/19/2023, 4:33 PM  Clinical Narrative: Patient is expected to discharge home after passing with PT. CSW met with patient to confirm discharge plan. Patient will go home with HHPT through [facility], which was prearranged in orthopedist's office. Patient will need a rolling walker and is agreeable to DME referral to [employer]. CSW made referral to Zack with [employer]. [employer] to deliver rolling walker to room. TOC signing off.  Final next level of care: Home w Home Health Services Barriers to Discharge: No Barriers Identified  Patient Goals and CMS Choice CMS Medicare.gov Compare Post Acute Care list provided to:: Patient Choice offered to / list presented to : Patient  Discharge Plan and Services Additional resources added to the After Visit Summary for        DME Arranged: Walker rolling DME Agency: [employer] Date DME Agency Contacted: 01/19/23 Time DME Agency Contacted: [id number] Representative spoke with at DME Agency: Zack HH Arranged: PT HH Agency: [facility] Representative spoke with at [facility] Agency: Prearranged in orthopedist's office  Social Determinants of Health (SDOH) Interventions SDOH Screenings   Food Insecurity: No Food Insecurity (01/19/2023)  Housing: Low Risk  (01/19/2023)  Transportation Needs: No Transportation Needs (01/19/2023)  Utilities: Not At Risk (01/19/2023)  Tobacco Use: Medium Risk (01/19/2023)   Readmission Risk Interventions     No data to display

## 2023-01-20 DIAGNOSIS — Z79899 Other long term (current) drug therapy: Secondary | ICD-10-CM | POA: Diagnosis not present

## 2023-01-20 DIAGNOSIS — Z87891 Personal history of nicotine dependence: Secondary | ICD-10-CM | POA: Diagnosis not present

## 2023-01-20 DIAGNOSIS — J45909 Unspecified asthma, uncomplicated: Secondary | ICD-10-CM | POA: Diagnosis not present

## 2023-01-20 DIAGNOSIS — Z85828 Personal history of other malignant neoplasm of skin: Secondary | ICD-10-CM | POA: Diagnosis not present

## 2023-01-20 DIAGNOSIS — M1612 Unilateral primary osteoarthritis, left hip: Secondary | ICD-10-CM | POA: Diagnosis not present

## 2023-01-20 LAB — BASIC METABOLIC PANEL
Anion gap: 8 (ref 5–15)
BUN: 15 mg/dL (ref 8–23)
CO2: 24 mmol/L (ref 22–32)
Calcium: 8.7 mg/dL — ABNORMAL LOW (ref 8.9–10.3)
Chloride: 105 mmol/L (ref 98–111)
Creatinine, Ser: 0.89 mg/dL (ref 0.61–1.24)
GFR, Estimated: >60 mL/min (ref >60)
Glucose, Bld: 136 mg/dL — ABNORMAL HIGH (ref 70–99)
Potassium: 4.3 mmol/L (ref 3.5–5.1)
Sodium: 137 mmol/L (ref 135–145)

## 2023-01-20 LAB — CBC
HCT: 41.5 % (ref 39.0–52.0)
Hemoglobin: 13.6 g/dL (ref 13.0–17.0)
MCH: 30.2 pg (ref 26.0–34.0)
MCHC: 32.8 g/dL (ref 30.0–36.0)
MCV: 92.2 fL (ref 80.0–100.0)
Platelets: 198 10*3/uL (ref 150–400)
RBC: 4.5 MIL/uL (ref 4.22–5.81)
RDW: 13.4 % (ref 11.5–15.5)
WBC: 15.5 10*3/uL — ABNORMAL HIGH (ref 4.0–10.5)
nRBC: 0 % (ref 0.0–0.2)

## 2023-01-20 MED ORDER — OXYCODONE HCL 5 MG PO TABS: 5.0000 mg | ORAL_TABLET | Freq: Four times a day (QID) | ORAL | 0 refills | Status: DC | PRN

## 2023-01-20 MED ORDER — METHOCARBAMOL 500 MG PO TABS: 500.0000 mg | ORAL_TABLET | Freq: Four times a day (QID) | ORAL | 1 refills | Status: AC | PRN

## 2023-01-20 NOTE — Discharge Summary (Signed)
Patient ID: David Dillon MRN: [id number] DOB/AGE: 1950-08-11 72 y.o.  Admit date: 01/19/2023 Discharge date: 01/20/2023  Admission Diagnoses:  Principal Problem:   Unilateral primary osteoarthritis, left hip Active Problems:   Status post total replacement of left hip   Discharge Diagnoses:  Same  Past Medical History:  Diagnosis Date   Cancer (HCC)    basal cell   DDD (degenerative disc disease), lumbosacral    Dental bridge present    lower   Environmental allergies    pollen, grass, trees   GERD (gastroesophageal reflux disease)    History of asthma    > 30 years ago   Immature cataract of both eyes    OA (osteoarthritis)    bilateral hand   Osteochondral lesion of talar dome 08/2017   right   Pneumonia    Sinus headache    Urinary frequency     Surgeries: Procedure(s): LEFT TOTAL HIP ARTHROPLASTY ANTERIOR APPROACH on 01/19/2023   Consultants:   Discharged Condition: Improved  Hospital Course: NICKLAUS MACKIE is an 72 y.o. male who was admitted 01/19/2023 for operative treatment ofUnilateral primary osteoarthritis, left hip. Patient has severe unremitting pain that affects sleep, daily activities, and work/hobbies. After pre-op clearance the patient was taken to the operating room on 01/19/2023 and underwent  Procedure(s): LEFT TOTAL HIP ARTHROPLASTY ANTERIOR APPROACH.    Patient was given perioperative antibiotics:  Anti-infectives (From admission, onward)    Start     Dose/Rate Route Frequency Ordered Stop   01/19/23 1730  ceFAZolin (ANCEF) IVPB 1 g/50 mL premix        1 g 100 mL/hr over 30 Minutes Intravenous Every 6 hours 01/19/23 1426 01/20/23 0025   01/19/23 0915  ceFAZolin (ANCEF) IVPB 2g/100 mL premix        2 g 200 mL/hr over 30 Minutes Intravenous On call to O.R. 01/19/23 0901 01/19/23 1140        Patient was given sequential compression devices, early ambulation, and chemoprophylaxis to prevent DVT.  Patient benefited maximally from  hospital stay and there were no complications.    Recent vital signs: Patient Vitals for the past 24 hrs:  BP Temp Temp src Pulse Resp SpO2  01/20/23 0935 129/64 97.9 F (36.6 C) Oral 81 18 96 %  01/20/23 0550 (!) 146/75 98.5 F (36.9 C) Oral 75 18 95 %  01/20/23 0149 124/67 98.2 F (36.8 C) Oral 80 17 94 %  01/19/23 2124 130/76 98.3 F (36.8 C) Oral 89 17 94 %  01/19/23 1729 139/83 98.3 F (36.8 C) -- 80 17 95 %  01/19/23 1437 124/70 (!) 97.5 F (36.4 C) Axillary (!) 53 16 98 %  01/19/23 1415 114/75 (!) 97.4 F (36.3 C) -- (!) 55 16 99 %  01/19/23 1400 116/67 -- -- (!) 56 13 97 %  01/19/23 1345 104/70 -- -- (!) 55 14 96 %  01/19/23 1330 106/64 -- -- (!) 59 10 95 %  01/19/23 1315 98/61 -- -- (!) 59 14 96 %  01/19/23 1300 (!) 95/59 -- -- (!) 56 16 99 %  01/19/23 1247 (!) 92/49 98.7 F (37.1 C) -- (!) 57 15 98 %     Recent laboratory studies:  Recent Labs    01/20/23 0318  WBC 15.5*  HGB 13.6  HCT 41.5  PLT 198  NA 137  K 4.3  CL 105  CO2 24  BUN 15  CREATININE 0.89  GLUCOSE 136*  CALCIUM 8.7*  Discharge Medications:   Allergies as of 01/20/2023   No Known Allergies      Medication List     TAKE these medications    acetaminophen 500 MG tablet Commonly known as: TYLENOL Take 1,000 mg by mouth every 6 (six) hours as needed for moderate pain.   cetirizine 10 MG tablet Commonly known as: ZYRTEC Take 10 mg by mouth daily as needed for allergies.   famotidine 20 MG tablet Commonly known as: PEPCID Take 20 mg by mouth daily as needed for heartburn or indigestion.   fluticasone 50 MCG/ACT nasal spray Commonly known as: FLONASE Place 2 sprays into both nostrils daily as needed for allergies or rhinitis.   hydrocortisone 2.5 % rectal cream Commonly known as: ANUSOL-HC Place 1 Application rectally 2 (two) times daily as needed for hemorrhoids or anal itching.   Mens 50+ Multi Vitamin/Min Tabs Take 1 tablet by mouth daily.   methocarbamol 500 MG  tablet Commonly known as: ROBAXIN Take 1 tablet (500 mg total) by mouth every 6 (six) hours as needed for muscle spasms.   naproxen sodium 220 MG tablet Commonly known as: ALEVE Take 220 mg by mouth daily as needed (pain).   oxyCODONE 5 MG immediate release tablet Commonly known as: Oxy IR/ROXICODONE Take 1-2 tablets (5-10 mg total) by mouth every 6 (six) hours as needed for moderate pain (pain score 4-6).   psyllium 58.6 % packet Commonly known as: METAMUCIL Take 1 packet by mouth daily as needed (constipation).   valACYclovir 500 MG tablet Commonly known as: VALTREX Take 500 mg by mouth 2 (two) times daily as needed.               Durable Medical Equipment  (From admission, onward)           Start     Ordered   01/19/23 1426  DME 3 n 1  Once        01/19/23 1426   01/19/23 1426  DME Walker rolling  Once       Question Answer Comment  Walker: With 5 Inch Wheels   Patient needs a walker to treat with the following condition Status post total replacement of left hip      01/19/23 1426            Diagnostic Studies: DG Pelvis Portable  Result Date: 01/19/2023 CLINICAL DATA:  Postoperative for left total hip replacement EXAM: PORTABLE PELVIS 1-2 VIEWS COMPARISON:  MRI 11/21/2022 FINDINGS: Left total hip prosthesis in place without periprosthetic fracture or acute complicating feature. Expected gas in the soft tissues. IMPRESSION: 1. Left total hip prosthesis in place without acute complicating feature. Electronically Signed   By: Gaylyn Rong M.D.   On: 01/19/2023 16:47   DG HIP UNILAT WITH PELVIS 1V LEFT  Result Date: 01/19/2023 CLINICAL DATA:  Left anterior hip arthroplasty. EXAM: DG HIP (WITH OR WITHOUT PELVIS) 1V*L* COMPARISON:  MR 11/21/2022 FINDINGS: Three images from portable C-arm radiography in the operating room were submitted. Images show postsurgical change compatible with left total hip arthroplasty. The hardware components are in anatomic  alignment without signs of acute fracture or dislocation. IMPRESSION: Status post left total hip arthroplasty. Electronically Signed   By: Signa Kell M.D.   On: 01/19/2023 16:21   DG C-Arm 1-60 Min-No Report  Result Date: 01/19/2023 Fluoroscopy was utilized by the requesting physician.  No radiographic interpretation.    Disposition: Discharge disposition: 01-Home or Self Care  Follow-up Information     [facility] [place] Follow up.   Why: [facility] will provide PT in the home after discharge.        Kathryne Hitch, MD Follow up in 2 week(s).   Specialty: Orthopedic Surgery Contact information: [address] [place] [address] [phone]                  Signed: Kathryne Hitch 01/20/2023, 11:28 AM

## 2023-01-20 NOTE — Progress Notes (Signed)
Physical Therapy Treatment Patient Details Name: David Dillon MRN: [id number] DOB: 1950/12/23 Today's Date: 01/20/2023   History of Present Illness 72 yo male presents to therapy s/p L THA, anterior approach on 01/19/2023 due to failure of conservative measures. Pt PMH includes but is not limited to: DDD, GERD, Asthma, and R ankle pain/osteochondral lesion of talar dome s/p arthroscopy (2019).    PT Comments  Pt is progressing well, agreeable and motivated, pain controlled during mobility; pt would like to practice stairs later today so will return for pm session; pt will likely be ready to d/c  this afternoon   If plan is discharge home, recommend the following: A little help with walking and/or transfers;A little help with bathing/dressing/bathroom;Assistance with cooking/housework;Assist for transportation;Help with stairs or ramp for entrance   Can travel by private vehicle        Equipment Recommendations  Rolling walker (2 wheels)    Recommendations for Other Services       Precautions / Restrictions Precautions Precautions: Fall Restrictions Weight Bearing Restrictions: No     Mobility  Bed Mobility Overal bed mobility: Needs Assistance Bed Mobility: Supine to Sit     Supine to sit: Contact guard, HOB elevated, Used rails     General bed mobility comments: min cues, CGA to guide LLE off bed    Transfers Overall transfer level: Needs assistance Equipment used: Rolling walker (2 wheels) Transfers: Sit to/from Stand Sit to Stand: Contact guard assist, Supervision           General transfer comment: cues for proper UE placement    Ambulation/Gait Ambulation/Gait assistance: Contact guard assist, Supervision Gait Distance (Feet): 120 Feet Assistive device: Rolling walker (2 wheels) Gait Pattern/deviations: Step-to pattern, Step-through pattern Gait velocity: decreased     General Gait Details: progression to step through pattern, good stability with  RW   Stairs             Wheelchair Mobility     Tilt Bed    Modified Rankin (Stroke Patients Only)       Balance Overall balance assessment: Needs assistance Sitting-balance support: Feet supported Sitting balance-Leahy Scale: Good     Standing balance support: During functional activity, Reliant on assistive device for balance Standing balance-Leahy Scale: Fair                              Cognition Arousal: Alert Behavior During Therapy: WFL for tasks assessed/performed Overall Cognitive Status: Within Functional Limits for tasks assessed                                          Exercises Total Joint Exercises Ankle Circles/Pumps: AROM, Both, 20 reps Quad Sets: AROM, Both, 10 reps Heel Slides: AAROM, Left, 10 reps Hip ABduction/ADduction: AROM, Left, 10 reps    General Comments        Pertinent Vitals/Pain Pain Assessment Pain Assessment: 0-10 Pain Score: 2  Pain Location: L hip Pain Descriptors / Indicators: Aching, Discomfort Pain Intervention(s): Limited activity within patient's tolerance, Monitored during session, Premedicated before session, Repositioned, Ice applied    Home Living                          Prior Function            PT Goals (  current goals can now be found in the care plan section) Acute Rehab PT Goals Patient Stated Goal: hiking in the park 3-4 miles 3/4 times a week PT Goal Formulation: With patient Time For Goal Achievement: 02/02/23 Potential to Achieve Goals: Good Progress towards PT goals: Progressing toward goals    Frequency    7X/week      PT Plan      Co-evaluation              AM-PAC PT "6 Clicks" Mobility   Outcome Measure  Help needed turning from your back to your side while in a flat bed without using bedrails?: A Little Help needed moving from lying on your back to sitting on the side of a flat bed without using bedrails?: A Little Help needed  moving to and from a bed to a chair (including a wheelchair)?: A Little Help needed standing up from a chair using your arms (e.g., wheelchair or bedside chair)?: A Little Help needed to walk in hospital room?: A Little Help needed climbing 3-5 steps with a railing? : A Lot 6 Click Score: 17    End of Session Equipment Utilized During Treatment: Gait belt Activity Tolerance: Patient tolerated treatment well;No increased pain Patient left: in chair;with call bell/phone within reach;with chair alarm set Nurse Communication: Mobility status PT Visit Diagnosis: Unsteadiness on feet (R26.81);Other abnormalities of gait and mobility (R26.89);Muscle weakness (generalized) (M62.81);Difficulty in walking, not elsewhere classified (R26.2);Pain Pain - Right/Left: Left Pain - part of body: Hip;Leg     Time: 4098-1191 PT Time Calculation (min) (ACUTE ONLY): 29 min  Charges:    $Gait Training: 8-22 mins $Therapeutic Exercise: 8-22 mins PT General Charges $$ ACUTE PT VISIT: 1 Visit                     Cristan Hout, PT  Acute Rehab Dept [facility]) [phone]  01/20/2023    [facility] 01/20/2023, 10:28 AM

## 2023-01-20 NOTE — Progress Notes (Signed)
Subjective: 1 Day Post-Op Procedure(s) (LRB): LEFT TOTAL HIP ARTHROPLASTY ANTERIOR APPROACH (Left) Patient reports pain as moderate.    Objective: Vital signs in last 24 hours: Temp:  [97.4 F (36.3 C)-98.7 F (37.1 C)] 97.9 F (36.6 C) (08/17 0935) Pulse Rate:  [53-89] 81 (08/17 0935) Resp:  [10-18] 18 (08/17 0935) BP: (92-146)/(49-83) 129/64 (08/17 0935) SpO2:  [94 %-99 %] 96 % (08/17 0935)  Intake/Output from previous day: 08/16 0701 - 08/17 0700 In: 3071.6 [P.O.:480; I.V.:2292.3; IV Piggyback:299.3] Out: 4170 [Urine:3970; Blood:200] Intake/Output this shift: Total I/O In: 480 [P.O.:480] Out: 800 [Urine:800]  Recent Labs    01/20/23 0318  HGB 13.6   Recent Labs    01/20/23 0318  WBC 15.5*  RBC 4.50  HCT 41.5  PLT 198   Recent Labs    01/20/23 0318  NA 137  K 4.3  CL 105  CO2 24  BUN 15  CREATININE 0.89  GLUCOSE 136*  CALCIUM 8.7*   No results for input(s): "LABPT", "INR" in the last 72 hours.  Sensation intact distally Intact pulses distally Dorsiflexion/Plantar flexion intact Incision: dressing C/D/I   Assessment/Plan: 1 Day Post-Op Procedure(s) (LRB): LEFT TOTAL HIP ARTHROPLASTY ANTERIOR APPROACH (Left) Up with therapy Discharge home with home health      David Dillon 01/20/2023, 11:26 AM

## 2023-01-20 NOTE — Progress Notes (Signed)
Physical Therapy Treatment Patient Details Name: David Dillon MRN: [id number] DOB: 01/13/1951 Today's Date: 01/20/2023   History of Present Illness 72 yo male presents to therapy s/p L THA, anterior approach on 01/19/2023 due to failure of conservative measures. Pt PMH includes but is not limited to: DDD, GERD, Asthma, and R ankle pain/osteochondral lesion of talar dome s/p arthroscopy (2019).    PT Comments  Pt progressing well this pm; see below for areas reviewed including THA HEP progression. pt feels ready to d/c home with family assist as needed.    If plan is discharge home, recommend the following: A little help with walking and/or transfers;A little help with bathing/dressing/bathroom;Assistance with cooking/housework;Assist for transportation;Help with stairs or ramp for entrance   Can travel by private vehicle        Equipment Recommendations  Rolling walker (2 wheels)    Recommendations for Other Services       Precautions / Restrictions Precautions Precautions: Fall Restrictions Weight Bearing Restrictions: No     Mobility  Bed Mobility Overal bed mobility: Needs Assistance Bed Mobility: Supine to Sit     Supine to sit: Contact guard, HOB elevated, Used rails     General bed mobility comments: min cues, CGA to guide LLE off bed    Transfers Overall transfer level: Needs assistance Equipment used: Rolling walker (2 wheels) Transfers: Sit to/from Stand Sit to Stand: Contact guard assist, Supervision           General transfer comment: cues for proper UE placement    Ambulation/Gait Ambulation/Gait assistance: Supervision, Modified independent (Device/Increase time) Gait Distance (Feet): 140 Feet Assistive device: Rolling walker (2 wheels) Gait Pattern/deviations: Step-to pattern, Step-through pattern Gait velocity: decreased     General Gait Details: progression to step through pattern, good stability with RW   Stairs Stairs: Yes Stairs  assistance: Contact guard assist, Supervision Stair Management: One rail Left, Step to pattern, Forwards Number of Stairs: 5 (x2) General stair comments: cues for sequence and technique, CGA for safety   Wheelchair Mobility     Tilt Bed    Modified Rankin (Stroke Patients Only)       Balance Overall balance assessment: Needs assistance Sitting-balance support: Feet supported Sitting balance-Leahy Scale: Good     Standing balance support: During functional activity, Reliant on assistive device for balance Standing balance-Leahy Scale: Fair                              Cognition Arousal: Alert Behavior During Therapy: WFL for tasks assessed/performed Overall Cognitive Status: Within Functional Limits for tasks assessed                                          Exercises Total Joint Exercises Ankle Circles/Pumps: AROM, Both, 20 reps Quad Sets: AROM, Both, 10 reps Heel Slides: AAROM, Left, 10 reps Hip ABduction/ADduction: AROM, Left, 10 reps    General Comments        Pertinent Vitals/Pain Pain Assessment Pain Assessment: 0-10 Pain Score: 4  Pain Location: L hip Pain Descriptors / Indicators: Aching, Discomfort Pain Intervention(s): Limited activity within patient's tolerance, Monitored during session, Premedicated before session    Home Living                          Prior Function  PT Goals (current goals can now be found in the care plan section) Acute Rehab PT Goals Patient Stated Goal: hiking in the park 3-4 miles 3/4 times a week PT Goal Formulation: With patient Time For Goal Achievement: 02/02/23 Potential to Achieve Goals: Good Progress towards PT goals: Progressing toward goals    Frequency    7X/week      PT Plan      Co-evaluation              AM-PAC PT "6 Clicks" Mobility   Outcome Measure  Help needed turning from your back to your side while in a flat bed without using  bedrails?: A Little Help needed moving from lying on your back to sitting on the side of a flat bed without using bedrails?: A Little Help needed moving to and from a bed to a chair (including a wheelchair)?: A Little Help needed standing up from a chair using your arms (e.g., wheelchair or bedside chair)?: A Little Help needed to walk in hospital room?: A Little Help needed climbing 3-5 steps with a railing? : A Lot 6 Click Score: 17    End of Session Equipment Utilized During Treatment: Gait belt Activity Tolerance: Patient tolerated treatment well;No increased pain Patient left: in chair;with call bell/phone within reach;with chair alarm set Nurse Communication: Mobility status PT Visit Diagnosis: Unsteadiness on feet (R26.81);Other abnormalities of gait and mobility (R26.89);Muscle weakness (generalized) (M62.81);Difficulty in walking, not elsewhere classified (R26.2);Pain Pain - Right/Left: Left Pain - part of body: Hip;Leg     Time: 1610-9604 PT Time Calculation (min) (ACUTE ONLY): 16 min  Charges:    $Gait Training: 8-22 mins PT General Charges $$ ACUTE PT VISIT: 1 Visit                     Angalina Ante, PT  Acute Rehab Dept ([facility]) [phone]  01/20/2023    [facility] 01/20/2023, 3:08 PM

## 2023-01-20 NOTE — Discharge Instructions (Signed)

## 2023-01-21 DIAGNOSIS — M5137 Other intervertebral disc degeneration, lumbosacral region: Secondary | ICD-10-CM | POA: Diagnosis not present

## 2023-01-21 DIAGNOSIS — Z471 Aftercare following joint replacement surgery: Secondary | ICD-10-CM | POA: Diagnosis not present

## 2023-01-21 DIAGNOSIS — H269 Unspecified cataract: Secondary | ICD-10-CM | POA: Diagnosis not present

## 2023-01-21 DIAGNOSIS — M19042 Primary osteoarthritis, left hand: Secondary | ICD-10-CM | POA: Diagnosis not present

## 2023-01-21 DIAGNOSIS — M19041 Primary osteoarthritis, right hand: Secondary | ICD-10-CM | POA: Diagnosis not present

## 2023-01-21 DIAGNOSIS — J45909 Unspecified asthma, uncomplicated: Secondary | ICD-10-CM | POA: Diagnosis not present

## 2023-01-21 DIAGNOSIS — Z96642 Presence of left artificial hip joint: Secondary | ICD-10-CM | POA: Diagnosis not present

## 2023-01-21 DIAGNOSIS — K219 Gastro-esophageal reflux disease without esophagitis: Secondary | ICD-10-CM | POA: Diagnosis not present

## 2023-01-21 DIAGNOSIS — R350 Frequency of micturition: Secondary | ICD-10-CM | POA: Diagnosis not present

## 2023-01-22 ENCOUNTER — Encounter (HOSPITAL_COMMUNITY): Payer: Self-pay | Admitting: Orthopaedic Surgery

## 2023-01-22 ENCOUNTER — Telehealth: Payer: Self-pay | Admitting: *Deleted

## 2023-01-22 NOTE — Telephone Encounter (Signed)
Ortho bundle D/C call completed. 

## 2023-01-24 DIAGNOSIS — J45909 Unspecified asthma, uncomplicated: Secondary | ICD-10-CM | POA: Diagnosis not present

## 2023-01-24 DIAGNOSIS — M19042 Primary osteoarthritis, left hand: Secondary | ICD-10-CM | POA: Diagnosis not present

## 2023-01-24 DIAGNOSIS — H269 Unspecified cataract: Secondary | ICD-10-CM | POA: Diagnosis not present

## 2023-01-24 DIAGNOSIS — K219 Gastro-esophageal reflux disease without esophagitis: Secondary | ICD-10-CM | POA: Diagnosis not present

## 2023-01-24 DIAGNOSIS — M19041 Primary osteoarthritis, right hand: Secondary | ICD-10-CM | POA: Diagnosis not present

## 2023-01-24 DIAGNOSIS — Z96642 Presence of left artificial hip joint: Secondary | ICD-10-CM | POA: Diagnosis not present

## 2023-01-24 DIAGNOSIS — R350 Frequency of micturition: Secondary | ICD-10-CM | POA: Diagnosis not present

## 2023-01-24 DIAGNOSIS — Z471 Aftercare following joint replacement surgery: Secondary | ICD-10-CM | POA: Diagnosis not present

## 2023-01-24 DIAGNOSIS — M5137 Other intervertebral disc degeneration, lumbosacral region: Secondary | ICD-10-CM | POA: Diagnosis not present

## 2023-01-26 ENCOUNTER — Telehealth: Payer: Self-pay | Admitting: Orthopaedic Surgery

## 2023-01-26 DIAGNOSIS — R350 Frequency of micturition: Secondary | ICD-10-CM | POA: Diagnosis not present

## 2023-01-26 DIAGNOSIS — H269 Unspecified cataract: Secondary | ICD-10-CM | POA: Diagnosis not present

## 2023-01-26 DIAGNOSIS — Z471 Aftercare following joint replacement surgery: Secondary | ICD-10-CM | POA: Diagnosis not present

## 2023-01-26 DIAGNOSIS — J45909 Unspecified asthma, uncomplicated: Secondary | ICD-10-CM | POA: Diagnosis not present

## 2023-01-26 DIAGNOSIS — Z96642 Presence of left artificial hip joint: Secondary | ICD-10-CM | POA: Diagnosis not present

## 2023-01-26 DIAGNOSIS — M19042 Primary osteoarthritis, left hand: Secondary | ICD-10-CM | POA: Diagnosis not present

## 2023-01-26 DIAGNOSIS — M5137 Other intervertebral disc degeneration, lumbosacral region: Secondary | ICD-10-CM | POA: Diagnosis not present

## 2023-01-26 DIAGNOSIS — K219 Gastro-esophageal reflux disease without esophagitis: Secondary | ICD-10-CM | POA: Diagnosis not present

## 2023-01-26 DIAGNOSIS — M19041 Primary osteoarthritis, right hand: Secondary | ICD-10-CM | POA: Diagnosis not present

## 2023-01-26 NOTE — Telephone Encounter (Signed)
Princess called. She is the PT working in home with patient. Says she replaced the dressing and patient tolerated PT. Her cb# [phone]

## 2023-01-30 DIAGNOSIS — Z96642 Presence of left artificial hip joint: Secondary | ICD-10-CM | POA: Diagnosis not present

## 2023-01-30 DIAGNOSIS — M19042 Primary osteoarthritis, left hand: Secondary | ICD-10-CM | POA: Diagnosis not present

## 2023-01-30 DIAGNOSIS — M5137 Other intervertebral disc degeneration, lumbosacral region: Secondary | ICD-10-CM | POA: Diagnosis not present

## 2023-01-30 DIAGNOSIS — J45909 Unspecified asthma, uncomplicated: Secondary | ICD-10-CM | POA: Diagnosis not present

## 2023-01-30 DIAGNOSIS — Z471 Aftercare following joint replacement surgery: Secondary | ICD-10-CM | POA: Diagnosis not present

## 2023-01-30 DIAGNOSIS — K219 Gastro-esophageal reflux disease without esophagitis: Secondary | ICD-10-CM | POA: Diagnosis not present

## 2023-01-30 DIAGNOSIS — R350 Frequency of micturition: Secondary | ICD-10-CM | POA: Diagnosis not present

## 2023-01-30 DIAGNOSIS — M19041 Primary osteoarthritis, right hand: Secondary | ICD-10-CM | POA: Diagnosis not present

## 2023-01-30 DIAGNOSIS — H269 Unspecified cataract: Secondary | ICD-10-CM | POA: Diagnosis not present

## 2023-01-31 DIAGNOSIS — H269 Unspecified cataract: Secondary | ICD-10-CM | POA: Diagnosis not present

## 2023-01-31 DIAGNOSIS — K219 Gastro-esophageal reflux disease without esophagitis: Secondary | ICD-10-CM | POA: Diagnosis not present

## 2023-01-31 DIAGNOSIS — Z96642 Presence of left artificial hip joint: Secondary | ICD-10-CM | POA: Diagnosis not present

## 2023-01-31 DIAGNOSIS — M19042 Primary osteoarthritis, left hand: Secondary | ICD-10-CM | POA: Diagnosis not present

## 2023-01-31 DIAGNOSIS — Z471 Aftercare following joint replacement surgery: Secondary | ICD-10-CM | POA: Diagnosis not present

## 2023-01-31 DIAGNOSIS — M19041 Primary osteoarthritis, right hand: Secondary | ICD-10-CM | POA: Diagnosis not present

## 2023-01-31 DIAGNOSIS — J45909 Unspecified asthma, uncomplicated: Secondary | ICD-10-CM | POA: Diagnosis not present

## 2023-01-31 DIAGNOSIS — R350 Frequency of micturition: Secondary | ICD-10-CM | POA: Diagnosis not present

## 2023-01-31 DIAGNOSIS — M5137 Other intervertebral disc degeneration, lumbosacral region: Secondary | ICD-10-CM | POA: Diagnosis not present

## 2023-02-01 ENCOUNTER — Ambulatory Visit (INDEPENDENT_AMBULATORY_CARE_PROVIDER_SITE_OTHER): Payer: Medicare HMO | Admitting: Orthopaedic Surgery

## 2023-02-01 ENCOUNTER — Encounter: Payer: Self-pay | Admitting: Orthopaedic Surgery

## 2023-02-01 DIAGNOSIS — Z96642 Presence of left artificial hip joint: Secondary | ICD-10-CM

## 2023-02-01 NOTE — Progress Notes (Signed)
The patient is here for his first postoperative visit status post a left total hip arthroplasty.  He is an active 72 year old gentleman.  He is only taken Tylenol for pain and he has been compliant with a baby aspirin twice a day.  His left hip incision looks good.  The staples have been removed and Steri-Strips applied.  He does have a hematoma but no seroma.  His calf are soft.  There is some knee bruising to be expected and I explained why that is the case.  Overall though he looks great and he is just using a cane to get around with.  He is 49 and would like to go ahead and get back on his riding lawn more.  I told him to go slow.  I do not want him climbing ladders yet.  He knows to wait at least 2-3 more weeks before submerging underwater.  We will see him back in a month to see how he is doing overall but no x-rays are needed.

## 2023-02-02 ENCOUNTER — Other Ambulatory Visit: Payer: Self-pay | Admitting: Oncology

## 2023-02-02 DIAGNOSIS — Z006 Encounter for examination for normal comparison and control in clinical research program: Secondary | ICD-10-CM

## 2023-03-01 ENCOUNTER — Encounter: Payer: Self-pay | Admitting: Orthopaedic Surgery

## 2023-03-01 ENCOUNTER — Telehealth: Payer: Self-pay | Admitting: *Deleted

## 2023-03-01 ENCOUNTER — Ambulatory Visit: Payer: Medicare HMO | Admitting: Orthopaedic Surgery

## 2023-03-01 DIAGNOSIS — Z96642 Presence of left artificial hip joint: Secondary | ICD-10-CM

## 2023-03-01 NOTE — Progress Notes (Signed)
The patient is an active 72 year old gentleman who is 6 weeks status post a left total hip arthroplasty.  He says he is doing great reports good range of motion and strength.  He does have some aching and some weakness with his hip flexors but overall he is doing well.  He says it does feel a lot of occasion when he is hanging his leg off of something.  He is walking without a significant limp.  His leg lengths appear equal.  He tolerates me easily putting his left operative hip through range of motion.  He will continue to slowly increase his activities as comfort allows.  Most of these symptoms that he is having should hopefully resolve as time goes by.  From my standpoint we will see him in 6 months with an AP pelvis and lateral of his left hip.  If there is any issues before then he knows to let us know.

## 2023-03-01 NOTE — Telephone Encounter (Signed)
Ortho bundle in office meeting completed.

## 2023-04-02 ENCOUNTER — Other Ambulatory Visit (HOSPITAL_COMMUNITY)
Admission: RE | Admit: 2023-04-02 | Discharge: 2023-04-02 | Disposition: A | Discharge status: Home / Self Care | Payer: Medicare HMO | Source: Ambulatory Visit | Attending: Oncology | Admitting: Oncology

## 2023-04-02 DIAGNOSIS — Z006 Encounter for examination for normal comparison and control in clinical research program: Secondary | ICD-10-CM | POA: Insufficient documentation

## 2023-04-10 LAB — HELIX MOLECULAR SCREEN: Genetic Analysis Overall Interpretation: NEGATIVE

## 2023-04-10 LAB — GENECONNECT MOLECULAR SCREEN

## 2023-06-19 ENCOUNTER — Telehealth: Payer: Self-pay | Admitting: *Deleted

## 2023-06-19 NOTE — Telephone Encounter (Signed)
Ortho bundle 90 day call completed. 

## 2023-06-19 NOTE — Telephone Encounter (Signed)
Ortho bundle call . 

## 2023-08-07 DIAGNOSIS — H52223 Regular astigmatism, bilateral: Secondary | ICD-10-CM | POA: Diagnosis not present

## 2023-08-22 ENCOUNTER — Encounter: Payer: Self-pay | Admitting: Orthopaedic Surgery

## 2023-08-22 ENCOUNTER — Other Ambulatory Visit (INDEPENDENT_AMBULATORY_CARE_PROVIDER_SITE_OTHER): Payer: Self-pay

## 2023-08-22 ENCOUNTER — Ambulatory Visit: Admitting: Orthopaedic Surgery

## 2023-08-22 DIAGNOSIS — Z96642 Presence of left artificial hip joint: Secondary | ICD-10-CM

## 2023-08-22 NOTE — Progress Notes (Signed)
 The patient is now 7 months status post a left total hip replacement.  He reports that he is doing well and has good range of motion and strength.  On exam his left hip moves smoothly and fluidly.  He is an active 73 year old gentleman.  His leg and extremity are equal and there is no blocks to rotation of the left hip and no instability signs or symptoms.  Standing AP pelvis and a lateral left hip shows a well-seated total hip arthroplasty with no complicating features.  He will continue to increase his activities as comfort allows.  Will see him back at the 1 year standpoint for final AP pelvis.  If there are issues before then he knows to let us know.

## 2023-08-29 ENCOUNTER — Ambulatory Visit: Payer: Medicare HMO | Admitting: Orthopaedic Surgery

## 2023-08-29 DIAGNOSIS — H52203 Unspecified astigmatism, bilateral: Secondary | ICD-10-CM | POA: Diagnosis not present

## 2023-08-29 DIAGNOSIS — H2513 Age-related nuclear cataract, bilateral: Secondary | ICD-10-CM | POA: Diagnosis not present

## 2023-08-30 DIAGNOSIS — Z0000 Encounter for general adult medical examination without abnormal findings: Secondary | ICD-10-CM | POA: Diagnosis not present

## 2023-08-30 DIAGNOSIS — Z23 Encounter for immunization: Secondary | ICD-10-CM | POA: Diagnosis not present

## 2023-08-30 DIAGNOSIS — M51369 Other intervertebral disc degeneration, lumbar region without mention of lumbar back pain or lower extremity pain: Secondary | ICD-10-CM | POA: Diagnosis not present

## 2023-08-30 DIAGNOSIS — J309 Allergic rhinitis, unspecified: Secondary | ICD-10-CM | POA: Diagnosis not present

## 2023-08-30 DIAGNOSIS — K219 Gastro-esophageal reflux disease without esophagitis: Secondary | ICD-10-CM | POA: Diagnosis not present

## 2023-08-30 DIAGNOSIS — K644 Residual hemorrhoidal skin tags: Secondary | ICD-10-CM | POA: Diagnosis not present

## 2023-08-30 DIAGNOSIS — B001 Herpesviral vesicular dermatitis: Secondary | ICD-10-CM | POA: Diagnosis not present

## 2023-08-30 DIAGNOSIS — E786 Lipoprotein deficiency: Secondary | ICD-10-CM | POA: Diagnosis not present

## 2023-09-14 ENCOUNTER — Inpatient Hospital Stay (HOSPITAL_COMMUNITY)
Admission: EM | Admit: 2023-09-14 | Discharge: 2023-09-16 | DRG: 418 | Disposition: A | Discharge status: Home / Self Care | Attending: Surgery | Admitting: Surgery

## 2023-09-14 ENCOUNTER — Other Ambulatory Visit: Payer: Self-pay

## 2023-09-14 ENCOUNTER — Emergency Department (HOSPITAL_COMMUNITY)

## 2023-09-14 ENCOUNTER — Encounter (HOSPITAL_COMMUNITY): Payer: Self-pay | Admitting: *Deleted

## 2023-09-14 DIAGNOSIS — R9431 Abnormal electrocardiogram [ECG] [EKG]: Secondary | ICD-10-CM | POA: Diagnosis not present

## 2023-09-14 DIAGNOSIS — K828 Other specified diseases of gallbladder: Secondary | ICD-10-CM | POA: Diagnosis present

## 2023-09-14 DIAGNOSIS — Z87891 Personal history of nicotine dependence: Secondary | ICD-10-CM | POA: Diagnosis not present

## 2023-09-14 DIAGNOSIS — K82A1 Gangrene of gallbladder in cholecystitis: Secondary | ICD-10-CM | POA: Diagnosis not present

## 2023-09-14 DIAGNOSIS — R1011 Right upper quadrant pain: Secondary | ICD-10-CM | POA: Diagnosis not present

## 2023-09-14 DIAGNOSIS — Z8709 Personal history of other diseases of the respiratory system: Secondary | ICD-10-CM | POA: Diagnosis not present

## 2023-09-14 DIAGNOSIS — Z860100 Personal history of colon polyps, unspecified: Secondary | ICD-10-CM

## 2023-09-14 DIAGNOSIS — R1031 Right lower quadrant pain: Secondary | ICD-10-CM | POA: Diagnosis not present

## 2023-09-14 DIAGNOSIS — K819 Cholecystitis, unspecified: Principal | ICD-10-CM

## 2023-09-14 DIAGNOSIS — K8020 Calculus of gallbladder without cholecystitis without obstruction: Secondary | ICD-10-CM | POA: Diagnosis not present

## 2023-09-14 DIAGNOSIS — K8012 Calculus of gallbladder with acute and chronic cholecystitis without obstruction: Principal | ICD-10-CM | POA: Diagnosis present

## 2023-09-14 DIAGNOSIS — K8066 Calculus of gallbladder and bile duct with acute and chronic cholecystitis without obstruction: Secondary | ICD-10-CM | POA: Diagnosis not present

## 2023-09-14 DIAGNOSIS — K810 Acute cholecystitis: Secondary | ICD-10-CM | POA: Diagnosis not present

## 2023-09-14 DIAGNOSIS — K420 Umbilical hernia with obstruction, without gangrene: Secondary | ICD-10-CM | POA: Diagnosis present

## 2023-09-14 DIAGNOSIS — K812 Acute cholecystitis with chronic cholecystitis: Secondary | ICD-10-CM | POA: Diagnosis not present

## 2023-09-14 DIAGNOSIS — K8013 Calculus of gallbladder with acute and chronic cholecystitis with obstruction: Secondary | ICD-10-CM | POA: Diagnosis not present

## 2023-09-14 DIAGNOSIS — K7581 Nonalcoholic steatohepatitis (NASH): Secondary | ICD-10-CM | POA: Diagnosis present

## 2023-09-14 DIAGNOSIS — J309 Allergic rhinitis, unspecified: Secondary | ICD-10-CM | POA: Diagnosis present

## 2023-09-14 DIAGNOSIS — K644 Residual hemorrhoidal skin tags: Secondary | ICD-10-CM | POA: Insufficient documentation

## 2023-09-14 DIAGNOSIS — K8000 Calculus of gallbladder with acute cholecystitis without obstruction: Principal | ICD-10-CM | POA: Diagnosis present

## 2023-09-14 DIAGNOSIS — R1084 Generalized abdominal pain: Secondary | ICD-10-CM | POA: Diagnosis not present

## 2023-09-14 DIAGNOSIS — K760 Fatty (change of) liver, not elsewhere classified: Secondary | ICD-10-CM | POA: Diagnosis not present

## 2023-09-14 DIAGNOSIS — R1013 Epigastric pain: Secondary | ICD-10-CM | POA: Diagnosis not present

## 2023-09-14 DIAGNOSIS — K219 Gastro-esophageal reflux disease without esophagitis: Secondary | ICD-10-CM | POA: Diagnosis present

## 2023-09-14 DIAGNOSIS — K4090 Unilateral inguinal hernia, without obstruction or gangrene, not specified as recurrent: Secondary | ICD-10-CM | POA: Diagnosis not present

## 2023-09-14 DIAGNOSIS — R109 Unspecified abdominal pain: Secondary | ICD-10-CM | POA: Diagnosis not present

## 2023-09-14 LAB — CBC
HCT: 45.1 % (ref 39.0–52.0)
Hemoglobin: 14.9 g/dL (ref 13.0–17.0)
MCH: 30.3 pg (ref 26.0–34.0)
MCHC: 33 g/dL (ref 30.0–36.0)
MCV: 91.7 fL (ref 80.0–100.0)
Platelets: 222 10*3/uL (ref 150–400)
RBC: 4.92 MIL/uL (ref 4.22–5.81)
RDW: 13.2 % (ref 11.5–15.5)
WBC: 11.8 10*3/uL — ABNORMAL HIGH (ref 4.0–10.5)
nRBC: 0 % (ref 0.0–0.2)

## 2023-09-14 LAB — COMPREHENSIVE METABOLIC PANEL WITH GFR
ALT: 39 U/L (ref 0–44)
AST: 27 U/L (ref 15–41)
Albumin: 4.1 g/dL (ref 3.5–5.0)
Alkaline Phosphatase: 43 U/L (ref 38–126)
Anion gap: 8 (ref 5–15)
BUN: 13 mg/dL (ref 8–23)
CO2: 25 mmol/L (ref 22–32)
Calcium: 9.3 mg/dL (ref 8.9–10.3)
Chloride: 103 mmol/L (ref 98–111)
Creatinine, Ser: 0.85 mg/dL (ref 0.61–1.24)
GFR, Estimated: >60 mL/min (ref >60)
Glucose, Bld: 122 mg/dL — ABNORMAL HIGH (ref 70–99)
Potassium: 4.1 mmol/L (ref 3.5–5.1)
Sodium: 136 mmol/L (ref 135–145)
Total Bilirubin: 0.8 mg/dL (ref 0.0–1.2)
Total Protein: 7.2 g/dL (ref 6.5–8.1)

## 2023-09-14 LAB — DIFFERENTIAL
Abs Immature Granulocytes: 0.05 10*3/uL (ref 0.00–0.07)
Basophils Absolute: 0.1 10*3/uL (ref 0.0–0.1)
Basophils Relative: 1 %
Eosinophils Absolute: 0.1 10*3/uL (ref 0.0–0.5)
Eosinophils Relative: 1 %
Immature Granulocytes: 0 %
Lymphocytes Relative: 13 %
Lymphs Abs: 1.5 10*3/uL (ref 0.7–4.0)
Monocytes Absolute: 0.9 10*3/uL (ref 0.1–1.0)
Monocytes Relative: 7 %
Neutro Abs: 9 10*3/uL — ABNORMAL HIGH (ref 1.7–7.7)
Neutrophils Relative %: 78 %

## 2023-09-14 LAB — URINALYSIS, ROUTINE W REFLEX MICROSCOPIC
Bacteria, UA: NONE SEEN
Bilirubin Urine: NEGATIVE
Glucose, UA: NEGATIVE mg/dL
Ketones, ur: NEGATIVE mg/dL
Leukocytes,Ua: NEGATIVE
Nitrite: NEGATIVE
Protein, ur: NEGATIVE mg/dL
Specific Gravity, Urine: 1.014 (ref 1.005–1.030)
pH: 5 (ref 5.0–8.0)

## 2023-09-14 LAB — LIPASE, BLOOD: Lipase: 33 U/L (ref 11–51)

## 2023-09-14 MED ORDER — ONDANSETRON 4 MG PO TBDP: 4.0000 mg | ORAL_TABLET | Freq: Four times a day (QID) | ORAL | Status: DC | PRN

## 2023-09-14 MED ORDER — FLUTICASONE PROPIONATE 50 MCG/ACT NA SUSP: 2.0000 | Freq: Every day | NASAL | Status: DC | PRN

## 2023-09-14 MED ORDER — METHOCARBAMOL 1000 MG/10ML IJ SOLN: 1000.0000 mg | Freq: Four times a day (QID) | INTRAMUSCULAR | Status: DC | PRN

## 2023-09-14 MED ORDER — LORATADINE 10 MG PO TABS
10.0000 mg | ORAL_TABLET | Freq: Every day | ORAL | Status: DC
  Administered 2023-09-14 – 2023-09-16 (×2): 10 mg via ORAL
  Filled 2023-09-14 (×2): qty 1

## 2023-09-14 MED ORDER — FENTANYL CITRATE PF 50 MCG/ML IJ SOSY
25.0000 ug | PREFILLED_SYRINGE | Freq: Once | INTRAMUSCULAR | Status: AC
  Administered 2023-09-14: 25 ug via INTRAVENOUS
  Filled 2023-09-14: qty 1

## 2023-09-14 MED ORDER — ADULT MULTIVITAMIN W/MINERALS CH
1.0000 | ORAL_TABLET | Freq: Every day | ORAL | Status: DC
  Administered 2023-09-14 – 2023-09-16 (×2): 1 via ORAL
  Filled 2023-09-14 (×2): qty 1

## 2023-09-14 MED ORDER — ACETAMINOPHEN 500 MG PO TABS
1000.0000 mg | ORAL_TABLET | ORAL | Status: AC
  Administered 2023-09-15: 1000 mg via ORAL
  Filled 2023-09-14: qty 2

## 2023-09-14 MED ORDER — PIPERACILLIN-TAZOBACTAM 3.375 G IVPB
3.3750 g | Freq: Three times a day (TID) | INTRAVENOUS | Status: DC
  Administered 2023-09-14 – 2023-09-16 (×5): 3.375 g via INTRAVENOUS
  Filled 2023-09-14 (×5): qty 50

## 2023-09-14 MED ORDER — DIPHENHYDRAMINE HCL 50 MG/ML IJ SOLN: 12.5000 mg | Freq: Four times a day (QID) | INTRAMUSCULAR | Status: DC | PRN

## 2023-09-14 MED ORDER — HEPARIN SODIUM (PORCINE) 5000 UNIT/ML IJ SOLN
5000.0000 [IU] | Freq: Three times a day (TID) | INTRAMUSCULAR | Status: DC
  Administered 2023-09-14 – 2023-09-16 (×4): 5000 [IU] via SUBCUTANEOUS
  Filled 2023-09-14 (×5): qty 1

## 2023-09-14 MED ORDER — SALINE SPRAY 0.65 % NA SOLN: 1.0000 | Freq: Four times a day (QID) | NASAL | Status: DC | PRN

## 2023-09-14 MED ORDER — GABAPENTIN 100 MG PO CAPS: 200.0000 mg | ORAL_CAPSULE | ORAL | Status: AC

## 2023-09-14 MED ORDER — DIPHENHYDRAMINE HCL 12.5 MG/5ML PO ELIX: 12.5000 mg | ORAL_SOLUTION | Freq: Four times a day (QID) | ORAL | Status: DC | PRN

## 2023-09-14 MED ORDER — ENSURE PRE-SURGERY PO LIQD
296.0000 mL | Freq: Once | ORAL | Status: AC
  Administered 2023-09-15: 296 mL via ORAL
  Filled 2023-09-14: qty 296

## 2023-09-14 MED ORDER — HYDROMORPHONE HCL 1 MG/ML IJ SOLN
0.5000 mg | INTRAMUSCULAR | Status: DC | PRN
  Administered 2023-09-15: 1 mg via INTRAVENOUS
  Filled 2023-09-14: qty 1

## 2023-09-14 MED ORDER — NAPROXEN SODIUM 220 MG PO TABS: 220.0000 mg | ORAL_TABLET | Freq: Every day | ORAL | Status: DC | PRN

## 2023-09-14 MED ORDER — METHOCARBAMOL 500 MG PO TABS
1000.0000 mg | ORAL_TABLET | Freq: Four times a day (QID) | ORAL | Status: DC | PRN
  Administered 2023-09-15: 500 mg via ORAL
  Administered 2023-09-16: 1000 mg via ORAL
  Filled 2023-09-14 (×3): qty 2

## 2023-09-14 MED ORDER — GADOBUTROL 1 MMOL/ML IV SOLN
9.0000 mL | Freq: Once | INTRAVENOUS | Status: AC | PRN
  Administered 2023-09-14: 9 mL via INTRAVENOUS

## 2023-09-14 MED ORDER — PROCHLORPERAZINE EDISYLATE 10 MG/2ML IJ SOLN: 5.0000 mg | Freq: Four times a day (QID) | INTRAMUSCULAR | Status: DC | PRN

## 2023-09-14 MED ORDER — IOHEXOL 300 MG/ML  SOLN
100.0000 mL | Freq: Once | INTRAMUSCULAR | Status: AC | PRN
  Administered 2023-09-14: 100 mL via INTRAVENOUS

## 2023-09-14 MED ORDER — FAMOTIDINE 20 MG PO TABS: 20.0000 mg | ORAL_TABLET | Freq: Every day | ORAL | Status: DC | PRN

## 2023-09-14 MED ORDER — ACETAMINOPHEN 650 MG RE SUPP: 650.0000 mg | Freq: Four times a day (QID) | RECTAL | Status: DC | PRN

## 2023-09-14 MED ORDER — BUPIVACAINE LIPOSOME 1.3 % IJ SUSP
20.0000 mL | INTRAMUSCULAR | Status: AC
  Filled 2023-09-14: qty 20

## 2023-09-14 MED ORDER — SODIUM CHLORIDE 0.9 % IV SOLN
2.0000 g | Freq: Once | INTRAVENOUS | Status: AC
  Administered 2023-09-14: 2 g via INTRAVENOUS
  Filled 2023-09-14: qty 20

## 2023-09-14 MED ORDER — CALCIUM POLYCARBOPHIL 625 MG PO TABS
625.0000 mg | ORAL_TABLET | Freq: Two times a day (BID) | ORAL | Status: DC
  Administered 2023-09-14 – 2023-09-16 (×3): 625 mg via ORAL
  Filled 2023-09-14 (×3): qty 1

## 2023-09-14 MED ORDER — LACTATED RINGERS IV SOLN: INTRAVENOUS | Status: DC

## 2023-09-14 MED ORDER — METOPROLOL TARTRATE 5 MG/5ML IV SOLN: 5.0000 mg | Freq: Four times a day (QID) | INTRAVENOUS | Status: DC | PRN

## 2023-09-14 MED ORDER — MAGIC MOUTHWASH: 15.0000 mL | Freq: Four times a day (QID) | ORAL | Status: DC | PRN

## 2023-09-14 MED ORDER — CHLORHEXIDINE GLUCONATE CLOTH 2 % EX PADS
6.0000 | MEDICATED_PAD | Freq: Once | CUTANEOUS | Status: AC
  Administered 2023-09-14: 6 via TOPICAL

## 2023-09-14 MED ORDER — ACETAMINOPHEN 325 MG PO TABS
650.0000 mg | ORAL_TABLET | Freq: Four times a day (QID) | ORAL | Status: DC | PRN
  Administered 2023-09-14 – 2023-09-16 (×2): 650 mg via ORAL
  Filled 2023-09-14 (×3): qty 2

## 2023-09-14 MED ORDER — MENTHOL 3 MG MT LOZG: 1.0000 | LOZENGE | OROMUCOSAL | Status: DC | PRN

## 2023-09-14 MED ORDER — PROCHLORPERAZINE MALEATE 10 MG PO TABS: 10.0000 mg | ORAL_TABLET | Freq: Four times a day (QID) | ORAL | Status: DC | PRN

## 2023-09-14 MED ORDER — ONDANSETRON HCL 4 MG/2ML IJ SOLN: 4.0000 mg | Freq: Four times a day (QID) | INTRAMUSCULAR | Status: DC | PRN

## 2023-09-14 MED ORDER — HYDROCORTISONE (PERIANAL) 2.5 % EX CREA: 1.0000 | TOPICAL_CREAM | Freq: Two times a day (BID) | CUTANEOUS | Status: DC | PRN

## 2023-09-14 MED ORDER — PHENOL 1.4 % MT LIQD: 2.0000 | OROMUCOSAL | Status: DC | PRN

## 2023-09-14 MED ORDER — NAPROXEN 250 MG PO TABS: 250.0000 mg | ORAL_TABLET | Freq: Every day | ORAL | Status: DC | PRN

## 2023-09-14 MED ORDER — NAPHAZOLINE-GLYCERIN 0.012-0.25 % OP SOLN: 1.0000 [drp] | Freq: Four times a day (QID) | OPHTHALMIC | Status: DC | PRN

## 2023-09-14 MED ORDER — SODIUM CHLORIDE (PF) 0.9 % IJ SOLN
INTRAMUSCULAR | Status: AC
  Filled 2023-09-14: qty 50

## 2023-09-14 MED ORDER — SIMETHICONE 80 MG PO CHEW
40.0000 mg | CHEWABLE_TABLET | Freq: Four times a day (QID) | ORAL | Status: DC | PRN
  Administered 2023-09-14: 40 mg via ORAL
  Filled 2023-09-14: qty 1

## 2023-09-14 MED ORDER — BISACODYL 10 MG RE SUPP: 10.0000 mg | Freq: Two times a day (BID) | RECTAL | Status: DC | PRN

## 2023-09-14 MED ORDER — CHLORHEXIDINE GLUCONATE CLOTH 2 % EX PADS: 6.0000 | MEDICATED_PAD | Freq: Once | CUTANEOUS | Status: DC

## 2023-09-14 MED ORDER — LACTATED RINGERS IV BOLUS: 1000.0000 mL | Freq: Three times a day (TID) | INTRAVENOUS | Status: DC | PRN

## 2023-09-14 NOTE — ED Provider Notes (Signed)
 Accepted handoff at shift change from [facility]. Please see prior provider note for more detail.   Briefly: Patient is 73 y.o.   DDX: concern for Epigastric to RUQ abd pain -- on and off for 5 days. More consistent and worsened yesterday. CT questionable cholecystitis. RUQ [place] with stones vs neoplasm. Pending MRCP. Carl Best.   Plan: I independently interpreted imaging including MRCP which shows  1. Findings favor acute cholecystitis. There is lobulation along the  fundus of the gallbladder which is of unknown etiology. Differential  diagnosis includes xanthogranulomatous cholecystitis.  2. No intrahepatic or extrahepatic bile duct dilation. No  choledocholithiasis.  3. Multiple other nonacute observations, as described above.  . I agree with the radiologist interpretation.   Given findings favor acute cholecystitis reach back out to surgery who will plan to manage the patient primarily as he does not have other significant comorbidities.  Surgery agrees to admission.   [place] 09/14/23 1644    Jacalyn Lefevre, MD 09/14/23 1747

## 2023-09-14 NOTE — H&P (Signed)
 David Dillon  [birth year] [id number]  CARE TEAM:  PCP: Ceasar Lund, PA  Outpatient Care Team: Patient Care Team: Ceasar Lund, PA as PCP - General (Physician Assistant) Kathryne Hitch, MD as Consulting Physician (Orthopedic Surgery) Runell Gess, MD as Consulting Physician (Cardiology)  Inpatient Treatment Team: Treatment Team:  [place], Md, MD Aletta Edouard, NT Ccs, Md, MD Littie Deeds, NT Prosperi, Harrel Carina, PA-C Walnut, Katrina, Student-RN Michiel Sites, RN   This patient is a 73 y.o.male who presents today for surgical evaluation at the request of C. Prosperi, PA-C, .   Chief complaint / Reason for evaluation: Abdominal pain and abnormal gallbladder.   73 year old male.  History of some heartburn reflux normally on Pepcid.  Last night's having some nausea and felt like if he could throw up he would feel better.  Irregular bowels.  Cramping pain.  Subjective fevers and chills but did not take a temperature.  Last meal was eggs yesterday morning.  No other sick contacts.  Denies any history of inflammatory bowel disease or bowel syndrome.  Never had a abdominal surgery.   Recalls a similar episode a year ago after eating some [place] food.  Workup was rather underwhelming.   However this attack did not go away.  Initially tried to reach out to primary care office but felt so bad decided to go to the ER.  CAT scan done really against appendicitis.  Gallbladder seemed abnormal.  Ultrasound concerning for cholecystitis but gallbladder wall a little atypical.  MRI/MRCP recommended.  Surgical consultation requested.    Used to be rather physically active runner.  Some ankle issues.  Followed by Dr. Allie Bossier with orthopedics.  Got through a left hip arthroplasty last summer 2024 without major issues negative cardiac workup in 2017.  Can walk several miles without difficulty.  Usually moves his bowels a couple times a day.   Assessment  MALEKO Dillon  73 y.o. male       Problem List:  Principal Problem:   Acute calculous cholecystitis Active Problems:   Allergic rhinitis   Gastroesophageal reflux disease without esophagitis   History of colonic polyps   Acute on chronic cholecystitis with probable septation at dome of gallbladder.  No strong evidence of malignancy by MRCP/ MRI  Plan:  IV antibiotics.  Bowel rest with clears only.  Nausea and pain control.  Laparoscopic cholecystectomy with possible cholangiogram and liver biopsy.  Did caution there is a small chance that this could be a mass and this would require biopsying and getting out but MRCP seems reassuring.  To my view, initial CAT scan just shows some septation at the dome of the gallbladder but no major concern.  He may have some benign thickening.  Nothing too severe.  The anatomy & physiology of hepatobiliary & pancreatic function was discussed.  The pathophysiology of gallbladder dysfunction was discussed.  Natural history risks without surgery was discussed.   I feel the risks of no intervention will lead to serious problems that outweigh the operative risks; therefore, I recommended cholecystectomy to remove the pathology.  I explained laparoscopic techniques with possible need for an open approach.  Probable cholangiogram to evaluate the bilary tract was explained as well.    Risks such as bleeding, infection, diarrhea and other bowel changes, abscess, leak, injury to other organs, need for repair of tissues / organs, need for further treatment, stroke, heart attack, death, and other risks were discussed.  I noted a good likelihood this will help address the problem, but there is a chance it may not help.  Possibility that this will not correct all abdominal symptoms was explained.  Goals of post-operative recovery were discussed as well.  We will work to minimize complications.   Questions were answered.  The patient expresses understanding & wishes to  proceed with surgery.  Patient has an obvious periumbilical hernia.  It seems small and fat-containing.  Can place one of the ports for that and primarily repair at the time of surgery. The anatomy & physiology of the abdominal wall was discussed.  The pathophysiology of hernias was discussed.  Natural history risks without surgery including progeressive enlargement, pain, incarceration, & strangulation was discussed.   Contributors to complications such as smoking, obesity, diabetes, prior surgery, etc were discussed.   I feel the risks of no intervention will lead to serious problems that outweigh the operative risks; therefore, I recommended surgery to reduce and repair the hernia.  I explained laparoscopic techniques with possible need for an open approach.  I noted the probable use of mesh to patch and/or buttress the hernia repair  Risks such as bleeding, infection, abscess, need for further treatment, injury to other organs, need for repair of tissues / organs, stroke, heart attack, death, and other risks were discussed.  I noted a good likelihood this will help address the problem.   Goals of post-operative recovery were discussed as well.  Possibility that this will not correct all symptoms was explained.  I stressed the importance of low-impact activity, aggressive pain control, avoiding constipation, & not pushing through pain to minimize risk of post-operative chronic pain or injury. Possibility of reherniation especially with smoking, obesity, diabetes, immunosuppression, and other health conditions was discussed.  We will work to minimize complications.     Questions were answered.  The patient expresses understanding & wishes to proceed with surgery.    -monitor electrolytes & replace as needed  Keep K>4, Mg>2, Phos>3  -VTE prophylaxis- SCDs.  Anticoagulation prophyllaxis SQ as appropriate  -mobilize as tolerated to help recovery.  Enlist therapies in moderate/high risk patients as  appropriate  I updated the patient's status to the patient  Recommendations were made.  Questions were answered.  He expressed understanding & appreciation.  -Disposition:  Disposition:  The patient is from: Home Anticipate discharge to:  Home Anticipated Date of Discharge is:  April 13,2025   Barriers to discharge:  Pending Clinical improvement (more likely than not)  Patient currently is NOT MEDICALLY STABLE for discharge from the hospital from a surgery standpoint.     I reviewed nursing notes, ED provider notes, last 24 h vitals and pain scores, last 48 h intake and output, last 24 h labs and trends, and last 24 h imaging results. I have reviewed this patient's available data, including medical history, events of note, test results, etc as part of my evaluation.  A significant portion of that time was spent in counseling.  Care during the described time interval was provided by me.  This care required moderate level of medical decision making.  09/14/2023  Ardeth Sportsman, MD, FACS, MASCRS Esophageal, Gastrointestinal & Colorectal Surgery Robotic and Minimally Invasive Surgery  [facility] [address] [place], [place] [address] [phone] Fax [phone] Main  CONTACT INFORMATION: Weekday (9AM-5PM): Call [facility] main office at [phone] Weeknight (5PM-9AM) or Weekend/Holiday: Check EPIC "Web Links" tab & use "AMION" (  password " [email or web address]") for General Surgery [facility] coverage  Please, DO NOT use SecureChat  (it is not reliable communication to reach operating surgeons & will lead to a delay in care).   Epic staff messaging available for outptient concerns needing 1-2 business day response.      09/14/2023      Past Medical History:  Diagnosis Date   Cancer (HCC)    basal cell   DDD (degenerative disc disease), lumbosacral    Dental bridge present    lower   Environmental allergies    pollen, grass,  trees   GERD (gastroesophageal reflux disease)    History of asthma    > 30 years ago   Immature cataract of both eyes    OA (osteoarthritis)    bilateral hand   Osteochondral lesion of talar dome 08/2017   right   Pneumonia    Sinus headache    Urinary frequency     Past Surgical History:  Procedure Laterality Date   ANKLE ARTHROSCOPY WITH DRILLING/MICROFRACTURE Right 08/23/2017   Procedure: Ankle Arthroscopy with Extensive Debridement and Grafting of the Osteochondral Lesions of Talus;  Surgeon: Toni Arthurs, MD;  Location: [facility];  Service: Orthopedics;  Laterality: Right;   COLONOSCOPY  03/2002   DENTAL SURGERY     TOTAL HIP ARTHROPLASTY Left 01/19/2023   Procedure: LEFT TOTAL HIP ARTHROPLASTY ANTERIOR APPROACH;  Surgeon: Kathryne Hitch, MD;  Location: [facility];  Service: Orthopedics;  Laterality: Left;   VASECTOMY      Social History   Socioeconomic History   Marital status: Married    Spouse name: Not on file   Number of children: 3   Years of education: Not on file   Highest education level: Not on file  Occupational History   Not on file  Tobacco Use   Smoking status: Former    Current packs/day: 0.00    Types: Cigarettes    Quit date: 06/05/1983    Years since quitting: 40.3   Smokeless tobacco: Never  Vaping Use   Vaping status: Never Used  Substance and Sexual Activity   Alcohol use: Yes    Comment: 1-2 beers/day   Drug use: No    Comment: gummies   Sexual activity: Not on file  Other Topics Concern   Not on file  Social History Narrative   Not on file   Social Drivers of Health   Financial Resource Strain: Not on file  Food Insecurity: No Food Insecurity (01/19/2023)   Hunger Vital Sign    Worried About Running Out of Food in the Last Year: Never true    Ran Out of Food in the Last Year: Never true  Transportation Needs: No Transportation Needs (01/19/2023)   PRAPARE - Administrator, Civil Service (Medical):  No    Lack of Transportation (Non-Medical): No  Physical Activity: Not on file  Stress: Not on file  Social Connections: Not on file  Intimate Partner Violence: Not At Risk (01/19/2023)   Humiliation, Afraid, Rape, and Kick questionnaire    Fear of Current or Ex-Partner: No    Emotionally Abused: No    Physically Abused: No    Sexually Abused: No    Family History  Problem Relation Age of Onset   Diabetes Mother    Breast cancer Mother    Heart attack Father 74   Breast cancer Sister    CVA Maternal Grandmother 75   Colon cancer Maternal Uncle  Melanoma Daughter     Current Facility-Administered Medications  Medication Dose Route Frequency Provider Last Rate Last Admin   acetaminophen (TYLENOL) tablet 650 mg  650 mg Oral Q6H PRN Karie Soda, MD       Or   acetaminophen (TYLENOL) suppository 650 mg  650 mg Rectal Q6H PRN Karie Soda, MD       [START ON 09/15/2023] acetaminophen (TYLENOL) tablet 1,000 mg  1,000 mg Oral On Call to OR Karie Soda, MD       bisacodyl (DULCOLAX) suppository 10 mg  10 mg Rectal Q12H PRN Karie Soda, MD       bupivacaine liposome (EXPAREL) 1.3 % injection 266 mg  20 mL Infiltration Once Karie Soda, MD       Chlorhexidine Gluconate Cloth 2 % PADS 6 each  6 each Topical Once Karie Soda, MD       And   Melene Muller ON 09/15/2023] Chlorhexidine Gluconate Cloth 2 % PADS 6 each  6 each Topical Once Karie Soda, MD       diphenhydrAMINE (BENADRYL) 12.5 MG/5ML elixir 12.5 mg  12.5 mg Oral Q6H PRN Karie Soda, MD       Or   diphenhydrAMINE (BENADRYL) injection 12.5 mg  12.5 mg Intravenous Q6H PRN Karie Soda, MD       famotidine (PEPCID) tablet 20 mg  20 mg Oral Daily PRN Karie Soda, MD       Melene Muller ON 09/15/2023] feeding supplement (ENSURE PRE-SURGERY) liquid 296 mL  296 mL Oral Once Karie Soda, MD       fluticasone (FLONASE) 50 MCG/ACT nasal spray 2 spray  2 spray Each Nare Daily PRN Karie Soda, MD       Melene Muller ON 09/15/2023] gabapentin  (NEURONTIN) capsule 200 mg  200 mg Oral On Call to OR Karie Soda, MD       heparin injection 5,000 Units  5,000 Units Subcutaneous Trixie Deis, MD       hydrocortisone (ANUSOL-HC) 2.5 % rectal cream 1 Application  1 Application Rectal BID PRN Karie Soda, MD       HYDROmorphone (DILAUDID) injection 0.5-2 mg  0.5-2 mg Intravenous Q2H PRN Karie Soda, MD       lactated ringers bolus 1,000 mL  1,000 mL Intravenous Q8H PRN Karie Soda, MD       lactated ringers infusion   Intravenous Continuous Karie Soda, MD       loratadine (CLARITIN) tablet 10 mg  10 mg Oral Daily Karie Soda, MD       magic mouthwash  15 mL Oral QID PRN Karie Soda, MD       Mens 50+ Multi Vitamin/Min TABS 1 tablet  1 tablet Oral Daily Karie Soda, MD       menthol-cetylpyridinium (CEPACOL) lozenge 3 mg  1 lozenge Oral PRN Karie Soda, MD       methocarbamol (ROBAXIN) injection 1,000 mg  1,000 mg Intravenous Q6H PRN Karie Soda, MD       methocarbamol (ROBAXIN) tablet 1,000 mg  1,000 mg Oral Q6H PRN Karie Soda, MD       metoprolol tartrate (LOPRESSOR) injection 5 mg  5 mg Intravenous Q6H PRN Karie Soda, MD       naphazoline-glycerin (CLEAR EYES REDNESS) ophth solution 1-2 drop  1-2 drop Both Eyes QID PRN Karie Soda, MD       naproxen sodium (ALEVE) tablet 220 mg  220 mg Oral Daily PRN Karie Soda, MD       ondansetron (  ZOFRAN-ODT) disintegrating tablet 4 mg  4 mg Oral Q6H PRN Karie Soda, MD       Or   ondansetron [facility]) injection 4 mg  4 mg Intravenous Q6H PRN Karie Soda, MD       phenol (CHLORASEPTIC) mouth spray 2 spray  2 spray Mouth/Throat PRN Karie Soda, MD       piperacillin-tazobactam (ZOSYN) IVPB 3.375 g  3.375 g Intravenous Trixie Deis, MD       polycarbophil (FIBERCON) tablet 625 mg  625 mg Oral BID Karie Soda, MD       prochlorperazine (COMPAZINE) tablet 10 mg  10 mg Oral Q6H PRN Karie Soda, MD       Or   prochlorperazine (COMPAZINE) injection 5-10 mg   5-10 mg Intravenous Q6H PRN Karie Soda, MD       simethicone (MYLICON) chewable tablet 40 mg  40 mg Oral Q6H PRN Karie Soda, MD       sodium chloride (OCEAN) 0.65 % nasal spray 1-2 spray  1-2 spray Each Nare Q6H PRN Karie Soda, MD       Current Outpatient Medications  Medication Sig Dispense Refill   acetaminophen (TYLENOL) 500 MG tablet Take 1,000 mg by mouth every 6 (six) hours as needed for moderate pain.     cetirizine (ZYRTEC) 10 MG tablet Take 10 mg by mouth daily as needed for allergies.     famotidine (PEPCID) 20 MG tablet Take 20 mg by mouth daily as needed for heartburn or indigestion.     fluticasone (FLONASE) 50 MCG/ACT nasal spray Place 2 sprays into both nostrils daily as needed for allergies or rhinitis.     hydrocortisone (ANUSOL-HC) 2.5 % rectal cream Place 1 Application rectally 2 (two) times daily as needed for hemorrhoids or anal itching.     methocarbamol (ROBAXIN) 500 MG tablet Take 1 tablet (500 mg total) by mouth every 6 (six) hours as needed for muscle spasms. 30 tablet 1   Multiple Vitamins-Minerals (MENS 50+ MULTI VITAMIN/MIN) TABS Take 1 tablet by mouth daily.     naproxen sodium (ALEVE) 220 MG tablet Take 220 mg by mouth daily as needed (pain).     oxyCODONE (OXY IR/ROXICODONE) 5 MG immediate release tablet Take 1-2 tablets (5-10 mg total) by mouth every 6 (six) hours as needed for moderate pain (pain score 4-6). 30 tablet 0   psyllium (METAMUCIL) 58.6 % packet Take 1 packet by mouth daily as needed (constipation).     valACYclovir (VALTREX) 500 MG tablet Take 500 mg by mouth 2 (two) times daily as needed.       No Known Allergies  ROS:   All other systems reviewed & are negative except per HPI or as noted below: Constitutional:  No fevers, chills, sweats.  Weight stable Eyes:  No vision changes, No discharge HENT:  No sore throats, nasal drainage Lymph: No neck swelling, No bruising easily Pulmonary:  No cough, productive sputum CV: No orthopnea, PND   Patient walks 1-2 hours without difficulty.  No exertional chest/neck/shoulder/arm pain.  GI: No personal nor family history of GI/colon cancer, inflammatory bowel disease, irritable bowel syndrome, allergy such as Celiac Sprue, dietary/dairy problems, colitis, ulcers nor gastritis.  No recent sick contacts/gastroenteritis.  No travel outside the country.  No changes in diet.  Renal: No UTIs, No hematuria Genital:  No drainage, bleeding, masses Musculoskeletal: No severe joint pain.  Good ROM major joints Skin:  No sores or lesions Heme/Lymph:  No easy bleeding.  No  swollen lymph nodes   BP (!) 144/75   Pulse 78   Temp 98.3 F (36.8 C) (Oral)   Resp 17   Ht 5\' 10"  (1.778 m)   Wt 90.7 kg   SpO2 98%   BMI 28.70 kg/m   Physical Exam:  Constitutional: Not cachectic.  Hygeine adequate.  Vitals signs as above.   Eyes: Pupils reactive, normal extraocular movements. Sclera nonicteric Neuro: CN II-XII intact.  No major focal sensory defects.  No major motor deficits. Lymph: No head/neck/groin lymphadenopathy Psych:  No severe agitation.  No severe anxiety.  Judgment & insight Adequate, Oriented x4, HENT: Normocephalic, Mucus membranes moist.  No thrush.   Neck: Supple, No tracheal deviation.  No obvious thyromegaly Chest: No pain to chest wall compression.  Good respiratory excursion.  No audible wheezing CV:  Pulses intact.  regular rhythm.  No major extremity edema  Abdomen:  Flat Hernia: 25 x 25 mm mass on the upper part of the umbilicus consistent with a chronically incarcerated umbilical hernia.  Seems fat-containing by CAT scan.. Diastasis recti: Not present. Soft.   Nondistended.  Mild discomfort in right upper quadrant but no major Murphy sign.  Rest of the abdomen is nontender nondistended..  No hepatomegaly.  No splenomegaly  Gen:  Inguinal hernia: Not present.  Inguinal lymph nodes: without lymphadenopathy.    Rectal: (Deferred)  Ext: No obvious deformity or contracture.   Edema: Not present.  No cyanosis Skin: No major subcutaneous nodules.  Warm and dry Musculoskeletal: Severe joint rigidity not present.  No obvious clubbing.  No digital petechiae.     Results:   Labs: Results for orders placed or performed during the hospital encounter of 09/14/23 (from the past 48 hours)  Lipase, blood     Status: None   Collection Time: 09/14/23  8:34 AM  Result Value Ref Range   Lipase 33 11 - 51 U/L    Comment: Performed at [facility], [address] [place], [place] [address]  Comprehensive metabolic panel     Status: Abnormal   Collection Time: 09/14/23  8:34 AM  Result Value Ref Range   Sodium 136 135 - 145 mmol/L   Potassium 4.1 3.5 - 5.1 mmol/L   Chloride 103 98 - 111 mmol/L   CO2 25 22 - 32 mmol/L   Glucose, Bld 122 (H) 70 - 99 mg/dL    Comment: Glucose reference range applies only to samples taken after fasting for at least 8 hours.   BUN 13 8 - 23 mg/dL   Creatinine, Ser 6.04 0.61 - 1.24 mg/dL   Calcium 9.3 8.9 - 54.0 mg/dL   Total Protein 7.2 6.5 - 8.1 g/dL   Albumin 4.1 3.5 - 5.0 g/dL   AST 27 15 - 41 U/L   ALT 39 0 - 44 U/L   Alkaline Phosphatase 43 38 - 126 U/L   Total Bilirubin 0.8 0.0 - 1.2 mg/dL   GFR, Estimated >98 >11 mL/min    Comment: (NOTE) Calculated using the CKD-EPI Creatinine Equation (2021)    Anion gap 8 5 - 15    Comment: Performed at [facility], [address] [place], [place] [address]  CBC     Status: Abnormal   Collection Time: 09/14/23  8:34 AM  Result Value Ref Range   WBC 11.8 (H) 4.0 - 10.5 K/uL   RBC 4.92 4.22 - 5.81 MIL/uL   Hemoglobin 14.9 13.0 - 17.0 g/dL   HCT 29.5 62.1 -  52.0 %   MCV 91.7 80.0 - 100.0 fL   MCH 30.3 26.0 - 34.0 pg   MCHC 33.0 30.0 - 36.0 g/dL   RDW 95.2 84.1 - 32.4 %   Platelets 222 150 - 400 K/uL   nRBC 0.0 0.0 - 0.2 %    Comment: Performed at [facility], [address] [place], [place] [address]  Differential     Status:  Abnormal   Collection Time: 09/14/23  8:34 AM  Result Value Ref Range   Neutrophils Relative % 78 %   Neutro Abs 9.0 (H) 1.7 - 7.7 K/uL   Lymphocytes Relative 13 %   Lymphs Abs 1.5 0.7 - 4.0 K/uL   Monocytes Relative 7 %   Monocytes Absolute 0.9 0.1 - 1.0 K/uL   Eosinophils Relative 1 %   Eosinophils Absolute 0.1 0.0 - 0.5 K/uL   Basophils Relative 1 %   Basophils Absolute 0.1 0.0 - 0.1 K/uL   Immature Granulocytes 0 %   Abs Immature Granulocytes 0.05 0.00 - 0.07 K/uL    Comment: Performed at [facility], [address], [place], [place] [address]  Urinalysis, Routine w reflex microscopic -Urine, Random     Status: Abnormal   Collection Time: 09/14/23  8:49 AM  Result Value Ref Range   Color, Urine YELLOW YELLOW   APPearance CLEAR CLEAR   Specific Gravity, Urine 1.014 1.005 - 1.030   pH 5.0 5.0 - 8.0   Glucose, UA NEGATIVE NEGATIVE mg/dL   Hgb urine dipstick SMALL (A) NEGATIVE   Bilirubin Urine NEGATIVE NEGATIVE   Ketones, ur NEGATIVE NEGATIVE mg/dL   Protein, ur NEGATIVE NEGATIVE mg/dL   Nitrite NEGATIVE NEGATIVE   Leukocytes,Ua NEGATIVE NEGATIVE   RBC / HPF 0-5 0 - 5 RBC/hpf   WBC, UA 0-5 0 - 5 WBC/hpf   Bacteria, UA NONE SEEN NONE SEEN   Squamous Epithelial / HPF 0-5 0 - 5 /HPF   Mucus PRESENT     Comment: Performed at [facility], [address], [place], [place] [address]    Imaging / Studies: MR ABDOMEN MRCP W WO CONTAST Result Date: 09/14/2023 CLINICAL DATA:  RUQ abdominal pain, biliary disease suspected, [place] nondiagnostic; 144615 Pain 144615 EXAM: MRI ABDOMEN WITHOUT AND WITH CONTRAST (INCLUDING MRCP) TECHNIQUE: Multiplanar multisequence MR imaging of the abdomen was performed both before and after the administration of intravenous contrast. Heavily T2-weighted images of the biliary and pancreatic ducts were obtained, and three-dimensional MRCP images were rendered by post processing. CONTRAST:  9mL GADAVIST GADOBUTROL 1 MMOL/ML  IV SOLN COMPARISON:  Ultrasound and CT scan abdomen from earlier the same day. FINDINGS: Lower chest: Unremarkable MR appearance to the lung bases. No pleural effusion. No pericardial effusion. Normal heart size. Hepatobiliary: The liver is normal in size and configuration. There is marked diffuse hepatic steatosis. No intrahepatic or extrahepatic bile duct dilatation. No choledocholithiasis. The gallbladder is physiologically distended (diameter up to 3.5 cm). There is mild-to-moderate diffuse gallbladder wall thickening/edema and mild-to-moderate pericholecystic fat stranding. There is also gallbladder mucosal hyperenhancement. Small to moderate volume dependent gallstones and sludge noted. There is lobulation along the fundus of the gallbladder which is of unknown etiology. Differential diagnosis includes xanthogranulomatous cholecystitis. Pancreas: No mass, inflammatory changes or other parenchymal abnormality identified. No main pancreatic duct dilation. Spleen:  Within normal limits in size and appearance. No focal mass. Adrenals/Urinary Tract: Unremarkable adrenal glands. No hydroureteronephrosis. No suspicious renal mass. Stomach/Bowel: Visualized portions within the abdomen  are unremarkable. No disproportionate dilation of bowel loops. Unremarkable appendix. Vascular/Lymphatic: No pathologically enlarged lymph nodes identified. No abdominal aortic aneurysm demonstrated. No ascites. Other:  None. Musculoskeletal: No suspicious bone lesions identified. IMPRESSION: 1. Findings favor acute cholecystitis. There is lobulation along the fundus of the gallbladder which is of unknown etiology. Differential diagnosis includes xanthogranulomatous cholecystitis. 2. No intrahepatic or extrahepatic bile duct dilation. No choledocholithiasis. 3. Multiple other nonacute observations, as described above. Electronically Signed   By: Jules Schick M.D.   On: 09/14/2023 16:13   MR 3D Recon At Scanner Result Date:  09/14/2023 CLINICAL DATA:  RUQ abdominal pain, biliary disease suspected, [place] nondiagnostic; [id number] Pain 144615 EXAM: MRI ABDOMEN WITHOUT AND WITH CONTRAST (INCLUDING MRCP) TECHNIQUE: Multiplanar multisequence MR imaging of the abdomen was performed both before and after the administration of intravenous contrast. Heavily T2-weighted images of the biliary and pancreatic ducts were obtained, and three-dimensional MRCP images were rendered by post processing. CONTRAST:  9mL GADAVIST GADOBUTROL 1 MMOL/ML IV SOLN COMPARISON:  Ultrasound and CT scan abdomen from earlier the same day. FINDINGS: Lower chest: Unremarkable MR appearance to the lung bases. No pleural effusion. No pericardial effusion. Normal heart size. Hepatobiliary: The liver is normal in size and configuration. There is marked diffuse hepatic steatosis. No intrahepatic or extrahepatic bile duct dilatation. No choledocholithiasis. The gallbladder is physiologically distended (diameter up to 3.5 cm). There is mild-to-moderate diffuse gallbladder wall thickening/edema and mild-to-moderate pericholecystic fat stranding. There is also gallbladder mucosal hyperenhancement. Small to moderate volume dependent gallstones and sludge noted. There is lobulation along the fundus of the gallbladder which is of unknown etiology. Differential diagnosis includes xanthogranulomatous cholecystitis. Pancreas: No mass, inflammatory changes or other parenchymal abnormality identified. No main pancreatic duct dilation. Spleen:  Within normal limits in size and appearance. No focal mass. Adrenals/Urinary Tract: Unremarkable adrenal glands. No hydroureteronephrosis. No suspicious renal mass. Stomach/Bowel: Visualized portions within the abdomen are unremarkable. No disproportionate dilation of bowel loops. Unremarkable appendix. Vascular/Lymphatic: No pathologically enlarged lymph nodes identified. No abdominal aortic aneurysm demonstrated. No ascites. Other:  None.  Musculoskeletal: No suspicious bone lesions identified. IMPRESSION: 1. Findings favor acute cholecystitis. There is lobulation along the fundus of the gallbladder which is of unknown etiology. Differential diagnosis includes xanthogranulomatous cholecystitis. 2. No intrahepatic or extrahepatic bile duct dilation. No choledocholithiasis. 3. Multiple other nonacute observations, as described above. Electronically Signed   By: Jules Schick M.D.   On: 09/14/2023 16:13   US Abdomen Limited RUQ (LIVER/GB) Result Date: 09/14/2023 CLINICAL DATA:  Right lower quadrant pain EXAM: ULTRASOUND ABDOMEN LIMITED RIGHT UPPER QUADRANT COMPARISON:  CT earlier 09/14/2023. FINDINGS: Gallbladder: Irregular contour to the distended gallbladder. There is a rounded structure within the gallbladder lumen measuring 14 mm but this is not shadow. Diffuse wall thickening. No Murphy's sign reported. Common bile duct: Diameter: 3 mm Liver: Diffusely echogenic hepatic parenchyma consistent with fatty liver infiltration. Portal vein is patent on color Doppler imaging with normal direction of blood flow towards the liver. Other: None. IMPRESSION: Irregular contour to the distended gallbladder with wall thickening. There is also echogenic lobular area in the gallbladder without clear shadowing measuring 14 mm. There are some separate small stones. This would have a differential including an and acute infectious or inflammatory process versus a more aggressive process such as a neoplastic process. Recommend further evaluation with MRI with and without contrast when appropriate dynamic Electronically Signed   By: Karen Kays M.D.   On: 09/14/2023 11:39   CT ABDOMEN PELVIS W  CONTRAST Result Date: 09/14/2023 CLINICAL DATA:  Abdominal pain, worsening yesterday and last night. Epigastric centered. EXAM: CT ABDOMEN AND PELVIS WITH CONTRAST TECHNIQUE: Multidetector CT imaging of the abdomen and pelvis was performed using the standard protocol  following bolus administration of intravenous contrast. RADIATION DOSE REDUCTION: This exam was performed according to the departmental dose-optimization program which includes automated exposure control, adjustment of the mA and/or kV according to patient size and/or use of iterative reconstruction technique. CONTRAST:  OMNIPAQUE IOHEXOL 300 MG/ML  SOLN COMPARISON:  03/15/2022 FINDINGS: Lower chest: Posterior lingular scarring. Normal heart size without pericardial or pleural effusion. Hepatobiliary: Mild hepatic steatosis with sparing adjacent the gallbladder. Subcentimeter posterior segment 2-3 low-density lesion is likely a cyst. Suspect edema adjacent the gallbladder neck including on 22/11. No calcified stone or biliary duct dilatation. Pancreas: Normal, without mass or ductal dilatation. Spleen: Normal in size, without focal abnormality. Adrenals/Urinary Tract: Normal adrenal glands. Normal kidneys, without hydronephrosis. Normal urinary bladder. Degraded evaluation of the pelvis, secondary to beam hardening artifact from left hip arthroplasty. Stomach/Bowel: Normal stomach, without wall thickening. Normal colon, appendix, and terminal ileum. Normal small bowel. Vascular/Lymphatic: Aortic atherosclerosis. No abdominopelvic adenopathy. Reproductive: Normal prostate for age. Other: No significant free fluid. No free intraperitoneal air. Small fat containing paraumbilical hernia. Tiny fat containing right inguinal hernia. Musculoskeletal: left hip arthroplasty. Mild right hip osteoarthritis. L4-5 and L5-S1 disc bulges IMPRESSION: 1. Findings suspicious for acute cholecystitis. Correlate with right upper quadrant symptoms and consider ultrasound. 2. No other explanation for patient's symptoms; normal appendix. 3.  Aortic Atherosclerosis (ICD10-I70.0). Electronically Signed   By: Jeronimo Greaves M.D.   On: 09/14/2023 10:25   XR HIP UNILAT W OR W/O PELVIS 1V LEFT Result Date: 08/22/2023 An AP pelvis and  lateral left hip shows a well-seated total hip arthroplasty on the left side.   Medications / Allergies: per chart  Antibiotics: Anti-infectives (From admission, onward)    Start     Dose/Rate Route Frequency Ordered Stop   09/14/23 1745  piperacillin-tazobactam (ZOSYN) IVPB 3.375 g        3.375 g 12.5 mL/hr over 240 Minutes Intravenous Every 8 hours 09/14/23 1726 09/21/23 1800   09/14/23 1630  cefTRIAXone (ROCEPHIN) 2 g in sodium chloride 0.9 % 100 mL IVPB        2 g 200 mL/hr over 30 Minutes Intravenous  Once 09/14/23 1619 09/14/23 1702         Note: Portions of this report may have been transcribed using voice recognition software. Every effort was made to ensure accuracy; however, inadvertent computerized transcription errors may be present.   Any transcriptional errors that result from this process are unintentional.    Ardeth Sportsman, MD, FACS, MASCRS Esophageal, Gastrointestinal & Colorectal Surgery Robotic and Minimally Invasive Surgery  [facility] [address] [place], [place] [address] [phone] Fax [phone] Main  CONTACT INFORMATION: Weekday (9AM-5PM): Call [facility] main office at [phone] Weeknight (5PM-9AM) or Weekend/Holiday: Check EPIC "Web Links" tab & use "AMION" (password " [email or web address]") for General Surgery [facility] coverage  Please, DO NOT use SecureChat  (it is not reliable communication to reach operating surgeons & will lead to a delay in care).   Epic staff messaging available for outptient concerns needing 1-2 business day response.       09/14/2023  5:34 PM

## 2023-09-14 NOTE — ED Provider Notes (Signed)
 [place] EMERGENCY DEPARTMENT AT [facility] Provider Note   CSN: [id number] Arrival date & time: 09/14/23  9528     History  Chief Complaint  Patient presents with  . Abdominal Pain    David Dillon is a 73 y.o. male past medical story significant for GERD presents today for central abdominal pain times approximately 5 days.  Patient states that the pain was initially on and off, however yesterday his symptoms worsen.  Patient has tried taking Pepcid and Metamucil without relief.  Patient denies nausea, vomiting, diarrhea, shortness of breath, chest pain, fever, or chills.  Patient denies any previous abdominal surgeries.   Abdominal Pain      Home Medications Prior to Admission medications   Medication Sig Start Date End Date Taking? Authorizing Provider  acetaminophen (TYLENOL) 500 MG tablet Take 1,000 mg by mouth every 6 (six) hours as needed for moderate pain.    [provider]  cetirizine (ZYRTEC) 10 MG tablet Take 10 mg by mouth daily as needed for allergies.    [provider]  famotidine (PEPCID) 20 MG tablet Take 20 mg by mouth daily as needed for heartburn or indigestion.    [provider]  fluticasone (FLONASE) 50 MCG/ACT nasal spray Place 2 sprays into both nostrils daily as needed for allergies or rhinitis.    [provider]  hydrocortisone (ANUSOL-HC) 2.5 % rectal cream Place 1 Application rectally 2 (two) times daily as needed for hemorrhoids or anal itching.    [provider]  methocarbamol (ROBAXIN) 500 MG tablet Take 1 tablet (500 mg total) by mouth every 6 (six) hours as needed for muscle spasms. 01/20/23   Kathryne Hitch, MD  Multiple Vitamins-Minerals (MENS 50+ MULTI VITAMIN/MIN) TABS Take 1 tablet by mouth daily.    [provider]  naproxen sodium (ALEVE) 220 MG tablet Take 220 mg by mouth daily as needed (pain).    [provider]  oxyCODONE (OXY IR/ROXICODONE) 5 MG  immediate release tablet Take 1-2 tablets (5-10 mg total) by mouth every 6 (six) hours as needed for moderate pain (pain score 4-6). 01/20/23   Kathryne Hitch, MD  psyllium (METAMUCIL) 58.6 % packet Take 1 packet by mouth daily as needed (constipation).    [provider]  valACYclovir (VALTREX) 500 MG tablet Take 500 mg by mouth 2 (two) times daily as needed. 08/28/22   [provider]      Allergies    Patient has no known allergies.    Review of Systems   Review of Systems  Gastrointestinal:  Positive for abdominal pain.    Physical Exam Updated Vital Signs BP (!) 169/157 (BP Location: Left Arm)   Pulse 90   Temp 98.3 F (36.8 C) (Oral)   Resp 18   Ht 5\' 10"  (1.778 m)   Wt 90.7 kg   SpO2 98%   BMI 28.70 kg/m  Physical Exam Vitals and nursing note reviewed.  Constitutional:      General: He is not in acute distress.    Appearance: He is well-developed. He is not toxic-appearing or diaphoretic.  HENT:     Head: Normocephalic and atraumatic.  Eyes:     Extraocular Movements: Extraocular movements intact.     Conjunctiva/sclera: Conjunctivae normal.  Cardiovascular:     Rate and Rhythm: Normal rate and regular rhythm.     Heart sounds: Normal heart sounds. No murmur heard. Pulmonary:     Effort: Pulmonary effort is normal. No  respiratory distress.     Breath sounds: Normal breath sounds.  Abdominal:     General: Abdomen is protuberant. Bowel sounds are normal. There is no distension.     Palpations: Abdomen is soft.     Tenderness: There is abdominal tenderness in the epigastric area and periumbilical area. There is no right CVA tenderness, left CVA tenderness or guarding. Negative signs include Murphy's sign, Rovsing's sign and McBurney's sign.  Musculoskeletal:        General: No swelling.     Cervical back: Neck supple.  Skin:    General: Skin is warm and dry.     Capillary Refill: Capillary refill takes less than 2 seconds.  Neurological:      General: No focal deficit present.     Mental Status: He is alert.  Psychiatric:        Mood and Affect: Mood normal.     ED Results / Procedures / Treatments   Labs (all labs ordered are listed, but only abnormal results are displayed) Labs Reviewed  LIPASE, BLOOD  COMPREHENSIVE METABOLIC PANEL WITH GFR  CBC  URINALYSIS, ROUTINE W REFLEX MICROSCOPIC    EKG None  Radiology No results found.  Procedures Procedures    Medications Ordered in ED Medications - No data to display  ED Course/ Medical Decision Making/ A&P Clinical Course as of 09/14/23 1503  Fri Sep 14, 2023  1502 Epigastric to RUQ abd pain -- on and off for 5 days. More consistent and worsened yesterday. CT questionable cholecystitis. RUQ [place] with stones vs neoplasm. Pending MRCP. Carl Best.  [CP]    Clinical Course User Index [CP] Olene Floss, PA-C                                 Medical Decision Making Amount and/or Complexity of Data Reviewed Labs: ordered. Radiology: ordered.  Risk Prescription drug management.   This patient presents to the ED for concern of abdominal pain, this involves an extensive number of treatment options, and is a complaint that carries with it a high risk of complications and morbidity.  The differential diagnosis includes GERD, pancreatitis, choledocholithiasis, appendicitis, SBO, diverticulitis   Co morbidities that complicate the patient evaluation  GERD   Lab Tests:  I Ordered, and personally interpreted labs.  The pertinent results include: Mild leukocytosis of 11.8, CMP and lipase WNL, UA showed small hemoglobin   Imaging Studies ordered:  I ordered imaging studies including CT abdomen pelvis with contrast  I independently visualized and interpreted imaging which showed findings suspicious for acute cholecystitis Ultrasound right upper quadrant which showed irregular contour to the distended gallbladder with wall thickening.  Echogenic  lobular area in the gallbladder without clear shadowing measuring 14 mm.  Separate small stones.  Recommend further evaluation with MRI with and without contrast. MRI/MRCP: Pending I agree with the radiologist interpretation   Cardiac Monitoring: / EKG:  The patient was maintained on a cardiac monitor.  I personally viewed and interpreted the cardiac monitored which showed an underlying rhythm of: Sinus rhythm, Incomplete RBBB and LAFB   Problem List / ED Course / Critical interventions / Medication management  I ordered medication including fentanyl for pain Reevaluation of the patient after these medicines showed that the patient improved I have reviewed the patients home medicines and have made adjustments as needed   Consultations Obtained:  I requested consultation with the General Surgery Lonia Farber,  and discussed lab and imaging findings as well as pertinent plan - they recommend: US Gallbladder and that they would come consult on the patient in the ED  Patient signed out to Luther Hearing, PA-C at shift change pending imaging which will determine dispo.          Final Clinical Impression(s) / ED Diagnoses Final diagnoses:  None    Rx / DC Orders ED Discharge Orders     None         Gretta Began 09/14/23 1503    Wynetta Fines, MD 09/14/23 [phone]

## 2023-09-14 NOTE — ED Triage Notes (Signed)
 On and off abd pain for a while with worsening symptoms yesterday and last night. Pain in epigastric area. Has tried OTC meds without relief.

## 2023-09-15 ENCOUNTER — Inpatient Hospital Stay (HOSPITAL_COMMUNITY): Admitting: Anesthesiology

## 2023-09-15 ENCOUNTER — Encounter (HOSPITAL_COMMUNITY): Admission: EM | Disposition: A | Discharge status: Home / Self Care | Payer: Self-pay | Source: Home / Self Care

## 2023-09-15 DIAGNOSIS — K8013 Calculus of gallbladder with acute and chronic cholecystitis with obstruction: Secondary | ICD-10-CM | POA: Diagnosis not present

## 2023-09-15 DIAGNOSIS — K812 Acute cholecystitis with chronic cholecystitis: Secondary | ICD-10-CM

## 2023-09-15 DIAGNOSIS — K420 Umbilical hernia with obstruction, without gangrene: Secondary | ICD-10-CM | POA: Diagnosis not present

## 2023-09-15 HISTORY — PX: LAPAROSCOPIC CHOLECYSTECTOMY SINGLE PORT: SHX5891

## 2023-09-15 LAB — SURGICAL PCR SCREEN
MRSA, PCR: NEGATIVE
Staphylococcus aureus: NEGATIVE

## 2023-09-15 LAB — HEPATIC FUNCTION PANEL
ALT: 29 U/L (ref 0–44)
AST: 22 U/L (ref 15–41)
Albumin: 3.5 g/dL (ref 3.5–5.0)
Alkaline Phosphatase: 41 U/L (ref 38–126)
Bilirubin, Direct: 0.2 mg/dL (ref 0.0–0.2)
Indirect Bilirubin: 0.9 mg/dL (ref 0.3–0.9)
Total Bilirubin: 1.1 mg/dL (ref 0.0–1.2)
Total Protein: 6.5 g/dL (ref 6.5–8.1)

## 2023-09-15 SURGERY — LAPAROSCOPIC CHOLECYSTECTOMY SINGLE SITE
Anesthesia: General

## 2023-09-15 MED ORDER — SODIUM CHLORIDE 0.9 % IV SOLN: 250.0000 mL | INTRAVENOUS | Status: DC | PRN

## 2023-09-15 MED ORDER — LACTATED RINGERS IV SOLN: INTRAVENOUS | Status: DC | PRN

## 2023-09-15 MED ORDER — 0.9 % SODIUM CHLORIDE (POUR BTL) OPTIME
TOPICAL | Status: DC | PRN
  Administered 2023-09-15: 1000 mL

## 2023-09-15 MED ORDER — FENTANYL CITRATE PF 50 MCG/ML IJ SOSY
25.0000 ug | PREFILLED_SYRINGE | INTRAMUSCULAR | Status: DC | PRN
  Administered 2023-09-15 (×2): 50 ug via INTRAVENOUS

## 2023-09-15 MED ORDER — ROCURONIUM BROMIDE 10 MG/ML (PF) SYRINGE
PREFILLED_SYRINGE | INTRAVENOUS | Status: AC
  Filled 2023-09-15: qty 10

## 2023-09-15 MED ORDER — LIDOCAINE HCL (CARDIAC) PF 100 MG/5ML IV SOSY
PREFILLED_SYRINGE | INTRAVENOUS | Status: DC | PRN
  Administered 2023-09-15: 60 mg via INTRATRACHEAL

## 2023-09-15 MED ORDER — ROCURONIUM BROMIDE 100 MG/10ML IV SOLN
INTRAVENOUS | Status: DC | PRN
  Administered 2023-09-15: 10 mg via INTRAVENOUS
  Administered 2023-09-15: 70 mg via INTRAVENOUS

## 2023-09-15 MED ORDER — ACETAMINOPHEN 10 MG/ML IV SOLN: 1000.0000 mg | Freq: Once | INTRAVENOUS | Status: DC | PRN

## 2023-09-15 MED ORDER — LACTATED RINGERS IR SOLN
Status: DC | PRN
  Administered 2023-09-15 (×3): 1

## 2023-09-15 MED ORDER — STERILE WATER FOR IRRIGATION IR SOLN
Status: DC | PRN
  Administered 2023-09-15: 1000 mL

## 2023-09-15 MED ORDER — DEXAMETHASONE SODIUM PHOSPHATE 10 MG/ML IJ SOLN
INTRAMUSCULAR | Status: AC
  Filled 2023-09-15: qty 1

## 2023-09-15 MED ORDER — PHENYLEPHRINE 80 MCG/ML (10ML) SYRINGE FOR IV PUSH (FOR BLOOD PRESSURE SUPPORT)
PREFILLED_SYRINGE | INTRAVENOUS | Status: DC | PRN
  Administered 2023-09-15 (×2): 80 ug via INTRAVENOUS

## 2023-09-15 MED ORDER — ONDANSETRON HCL 4 MG/2ML IJ SOLN
INTRAMUSCULAR | Status: DC | PRN
  Administered 2023-09-15: 4 mg via INTRAVENOUS

## 2023-09-15 MED ORDER — BUPIVACAINE-EPINEPHRINE 0.25% -1:200000 IJ SOLN
INTRAMUSCULAR | Status: DC | PRN
  Administered 2023-09-15: 30 mL

## 2023-09-15 MED ORDER — PROPOFOL 10 MG/ML IV BOLUS
INTRAVENOUS | Status: DC | PRN
  Administered 2023-09-15: 30 mg via INTRAVENOUS
  Administered 2023-09-15: 130 mg via INTRAVENOUS
  Administered 2023-09-15: 20 mg via INTRAVENOUS

## 2023-09-15 MED ORDER — LACTATED RINGERS IV BOLUS: 1000.0000 mL | Freq: Three times a day (TID) | INTRAVENOUS | Status: DC | PRN

## 2023-09-15 MED ORDER — ACETAMINOPHEN 500 MG PO TABS
ORAL_TABLET | ORAL | Status: DC
Start: 2023-09-15 — End: 2023-09-15
  Filled 2023-09-15: qty 2

## 2023-09-15 MED ORDER — BUPIVACAINE LIPOSOME 1.3 % IJ SUSP
INTRAMUSCULAR | Status: AC
  Filled 2023-09-15: qty 20

## 2023-09-15 MED ORDER — GABAPENTIN 300 MG PO CAPS
ORAL_CAPSULE | ORAL | Status: AC
  Filled 2023-09-15: qty 1

## 2023-09-15 MED ORDER — FENTANYL CITRATE (PF) 100 MCG/2ML IJ SOLN
INTRAMUSCULAR | Status: DC | PRN
  Administered 2023-09-15: 100 ug via INTRAVENOUS

## 2023-09-15 MED ORDER — PROPOFOL 10 MG/ML IV BOLUS
INTRAVENOUS | Status: AC
  Filled 2023-09-15: qty 20

## 2023-09-15 MED ORDER — TRAMADOL HCL 50 MG PO TABS
50.0000 mg | ORAL_TABLET | Freq: Four times a day (QID) | ORAL | Status: DC | PRN
  Administered 2023-09-15 – 2023-09-16 (×2): 100 mg via ORAL
  Filled 2023-09-15 (×2): qty 2

## 2023-09-15 MED ORDER — TRAMADOL HCL 50 MG PO TABS: 50.0000 mg | ORAL_TABLET | Freq: Four times a day (QID) | ORAL | 0 refills | Status: AC | PRN

## 2023-09-15 MED ORDER — SODIUM CHLORIDE 0.9% FLUSH
3.0000 mL | Freq: Two times a day (BID) | INTRAVENOUS | Status: DC
  Administered 2023-09-15: 3 mL via INTRAVENOUS

## 2023-09-15 MED ORDER — DEXAMETHASONE SODIUM PHOSPHATE 10 MG/ML IJ SOLN
INTRAMUSCULAR | Status: DC | PRN
  Administered 2023-09-15: 8 mg via INTRAVENOUS

## 2023-09-15 MED ORDER — FENTANYL CITRATE (PF) 100 MCG/2ML IJ SOLN
INTRAMUSCULAR | Status: AC
Start: 2023-09-15 — End: ?
  Filled 2023-09-15: qty 2

## 2023-09-15 MED ORDER — FENTANYL CITRATE PF 50 MCG/ML IJ SOSY
PREFILLED_SYRINGE | INTRAMUSCULAR | Status: AC
  Filled 2023-09-15: qty 1

## 2023-09-15 MED ORDER — ONDANSETRON HCL 4 MG/2ML IJ SOLN
INTRAMUSCULAR | Status: AC
  Filled 2023-09-15: qty 2

## 2023-09-15 MED ORDER — GABAPENTIN 100 MG PO CAPS
300.0000 mg | ORAL_CAPSULE | Freq: Every day | ORAL | Status: DC
  Administered 2023-09-15: 300 mg via ORAL
  Filled 2023-09-15: qty 3

## 2023-09-15 MED ORDER — PHENYLEPHRINE 80 MCG/ML (10ML) SYRINGE FOR IV PUSH (FOR BLOOD PRESSURE SUPPORT)
PREFILLED_SYRINGE | INTRAVENOUS | Status: AC
Start: 2023-09-15 — End: ?
  Filled 2023-09-15: qty 10

## 2023-09-15 MED ORDER — BUPIVACAINE-EPINEPHRINE (PF) 0.25% -1:200000 IJ SOLN
INTRAMUSCULAR | Status: AC
  Filled 2023-09-15: qty 30

## 2023-09-15 MED ORDER — SUGAMMADEX SODIUM 200 MG/2ML IV SOLN
INTRAVENOUS | Status: DC | PRN
  Administered 2023-09-15: 200 mg via INTRAVENOUS

## 2023-09-15 MED ORDER — SODIUM CHLORIDE 0.9% FLUSH: 3.0000 mL | INTRAVENOUS | Status: DC | PRN

## 2023-09-15 MED ORDER — LIDOCAINE HCL (PF) 2 % IJ SOLN
INTRAMUSCULAR | Status: AC
  Filled 2023-09-15: qty 5

## 2023-09-15 MED ORDER — MUPIROCIN 2 % EX OINT: 1.0000 | TOPICAL_OINTMENT | Freq: Two times a day (BID) | CUTANEOUS | Status: DC

## 2023-09-15 MED ORDER — BUPIVACAINE LIPOSOME 1.3 % IJ SUSP
INTRAMUSCULAR | Status: DC | PRN
  Administered 2023-09-15: 20 mL

## 2023-09-15 SURGICAL SUPPLY — 43 items
APPLIER CLIP 5 13 M/L LIGAMAX5 (MISCELLANEOUS) ×1 IMPLANT
BAG COUNTER SPONGE SURGICOUNT (BAG) IMPLANT
CABLE HIGH FREQUENCY MONO STRZ (ELECTRODE) ×1 IMPLANT
CHLORAPREP W/TINT 26 (MISCELLANEOUS) ×1 IMPLANT
CLIP APPLIE 5 13 M/L LIGAMAX5 (MISCELLANEOUS) ×1 IMPLANT
COVER MAYO STAND STRL (DRAPES) ×1 IMPLANT
COVER SURGICAL LIGHT HANDLE (MISCELLANEOUS) ×1 IMPLANT
DRAIN CHANNEL 19F RND (DRAIN) IMPLANT
DRAPE C-ARM 42X120 X-RAY (DRAPES) ×1 IMPLANT
DRAPE WARM FLUID 44X44 (DRAPES) ×1 IMPLANT
DRSG TEGADERM 4X4.75 (GAUZE/BANDAGES/DRESSINGS) ×1 IMPLANT
DRSG TEGADERM 6X8 (GAUZE/BANDAGES/DRESSINGS) IMPLANT
ELECT REM PT RETURN 15FT ADLT (MISCELLANEOUS) ×1 IMPLANT
ENDOLOOP SUT PDS II 0 18 (SUTURE) IMPLANT
EVACUATOR SILICONE 100CC (DRAIN) IMPLANT
GAUZE SPONGE 2X2 8PLY STRL LF (GAUZE/BANDAGES/DRESSINGS) ×1 IMPLANT
GAUZE SPONGE 4X4 12PLY STRL (GAUZE/BANDAGES/DRESSINGS) IMPLANT
GLOVE ECLIPSE 8.0 STRL XLNG CF (GLOVE) ×1 IMPLANT
GLOVE INDICATOR 8.0 STRL GRN (GLOVE) ×1 IMPLANT
GOWN STRL REUS W/ TWL XL LVL3 (GOWN DISPOSABLE) ×3 IMPLANT
IRRIG SUCT STRYKERFLOW 2 WTIP (MISCELLANEOUS) ×1 IMPLANT
IRRIGATION SUCT STRKRFLW 2 WTP (MISCELLANEOUS) ×1 IMPLANT
KIT BASIN OR (CUSTOM PROCEDURE TRAY) ×1 IMPLANT
KIT TURNOVER KIT A (KITS) IMPLANT
PAD POSITIONING PINK XL (MISCELLANEOUS) ×1 IMPLANT
PENCIL SMOKE EVACUATOR (MISCELLANEOUS) IMPLANT
POUCH RETRIEVAL ECOSAC 10 (ENDOMECHANICALS) IMPLANT
PROTECTOR NERVE ULNAR (MISCELLANEOUS) IMPLANT
SCISSORS LAP 5X35 DISP (ENDOMECHANICALS) ×1 IMPLANT
SET CHOLANGIOGRAPH MIX (MISCELLANEOUS) ×1 IMPLANT
SET TUBE SMOKE EVAC HIGH FLOW (TUBING) ×1 IMPLANT
SHEARS HARMONIC 36 ACE (MISCELLANEOUS) ×1 IMPLANT
SLEEVE Z-THREAD 5X100MM (TROCAR) ×1 IMPLANT
SPIKE FLUID TRANSFER (MISCELLANEOUS) ×1 IMPLANT
SUT MNCRL AB 4-0 PS2 18 (SUTURE) ×1 IMPLANT
SUT PDS AB 1 CT1 27 (SUTURE) ×2 IMPLANT
SUT PROLENE 2 0 CT 1 (SUTURE) IMPLANT
SUT PROLENE 2 0 CT2 30 (SUTURE) IMPLANT
SYR 20ML LL LF (SYRINGE) ×1 IMPLANT
TOWEL OR 17X26 10 PK STRL BLUE (TOWEL DISPOSABLE) ×1 IMPLANT
TRAY LAPAROSCOPIC (CUSTOM PROCEDURE TRAY) ×1 IMPLANT
TROCAR 5M 150ML BLDLS (TROCAR) ×1 IMPLANT
TROCAR Z-THREAD OPTICAL 5X100M (TROCAR) ×1 IMPLANT

## 2023-09-15 NOTE — Plan of Care (Signed)

## 2023-09-15 NOTE — Anesthesia Procedure Notes (Signed)
 Procedure Name: Intubation Date/Time: 09/15/2023 10:07 AM  Performed by: Darlena Ego, CRNAPre-anesthesia Checklist: Patient identified, Emergency Drugs available, Suction available and Patient being monitored Patient Re-evaluated:Patient Re-evaluated prior to induction Oxygen Delivery Method: Circle System Utilized Preoxygenation: Pre-oxygenation with 100% oxygen Induction Type: IV induction Ventilation: Mask ventilation without difficulty Laryngoscope Size: Miller and 2 Grade View: Grade I Tube type: Oral Number of attempts: 1 Airway Equipment and Method: Stylet and Oral airway Placement Confirmation: ETT inserted through vocal cords under direct vision, positive ETCO2 and breath sounds checked- equal and bilateral Secured at: 23 cm Tube secured with: Tape Dental Injury: Teeth and Oropharynx as per pre-operative assessment

## 2023-09-15 NOTE — Interval H&P Note (Signed)
 History and Physical Interval Note:  09/15/2023 9:35 AM  David Dillon  has presented today for surgery, with the diagnosis of cholecystitis.  The various methods of treatment have been discussed with the patient and family. After consideration of risks, benefits and other options for treatment, the patient has consented to  Procedure(s): LAPAROSCOPIC CHOLECYSTECTOMY SINGLE SITE (N/A) as a surgical intervention.  The patient's history has been reviewed, patient examined, no change in status, stable for surgery.  I have reviewed the patient's chart and labs.  Questions were answered to the patient's satisfaction.    I have re-reviewed the the patient's records, history, medications, and allergies.  I have re-examined the patient.  I again discussed intraoperative plans and goals of post-operative recovery.  The patient agrees to proceed.  David Dillon  February 28, 1951 [id number]  Patient Care Team: Ilsa Maltese, PA as PCP - General (Physician Assistant) Arnie Lao, MD as Consulting Physician (Orthopedic Surgery) Avanell Leigh, MD as Consulting Physician (Cardiology) Baldo Bonds, MD as Consulting Physician (Gastroenterology)  Patient Active Problem List   Diagnosis Date Noted   Acute calculous cholecystitis 09/14/2023   Allergic rhinitis 09/14/2023   External hemorrhoids 09/14/2023   Gastroesophageal reflux disease without esophagitis 09/14/2023   History of colonic polyps 09/14/2023   Status post total replacement of left hip 01/19/2023   Right ankle pain 05/02/2016    Past Medical History:  Diagnosis Date   Cancer (HCC)    basal cell   DDD (degenerative disc disease), lumbosacral    Dental bridge present    lower   Environmental allergies    pollen, grass, trees   GERD (gastroesophageal reflux disease)    History of asthma    > 30 years ago   Immature cataract of both eyes    OA (osteoarthritis)    bilateral hand   Osteochondral lesion of talar  dome 08/2017   right   Pneumonia    Sinus headache    Urinary frequency     Past Surgical History:  Procedure Laterality Date   ANKLE ARTHROSCOPY WITH DRILLING/MICROFRACTURE Right 08/23/2017   Procedure: Ankle Arthroscopy with Extensive Debridement and Grafting of the Osteochondral Lesions of Talus;  Surgeon: Amada Backer, MD;  Location: [facility];  Service: Orthopedics;  Laterality: Right;   COLONOSCOPY  03/2002   DENTAL SURGERY     TOTAL HIP ARTHROPLASTY Left 01/19/2023   Procedure: LEFT TOTAL HIP ARTHROPLASTY ANTERIOR APPROACH;  Surgeon: Arnie Lao, MD;  Location: [facility];  Service: Orthopedics;  Laterality: Left;   VASECTOMY      Social History   Socioeconomic History   Marital status: Married    Spouse name: Not on file   Number of children: 3   Years of education: Not on file   Highest education level: Not on file  Occupational History   Not on file  Tobacco Use   Smoking status: Former    Current packs/day: 0.00    Types: Cigarettes    Quit date: 06/05/1983    Years since quitting: 40.3   Smokeless tobacco: Never  Vaping Use   Vaping status: Never Used  Substance and Sexual Activity   Alcohol use: Yes    Comment: 1-2 beers/day   Drug use: No    Comment: gummies   Sexual activity: Not on file  Other Topics Concern   Not on file  Social History Narrative   Not on file   Social Drivers of Health   Financial  Resource Strain: Not on file  Food Insecurity: No Food Insecurity (09/15/2023)   Hunger Vital Sign    Worried About Running Out of Food in the Last Year: Never true    Ran Out of Food in the Last Year: Never true  Transportation Needs: No Transportation Needs (09/15/2023)   PRAPARE - Administrator, Civil Service (Medical): No    Lack of Transportation (Non-Medical): No  Physical Activity: Not on file  Stress: Not on file  Social Connections: Unknown (09/15/2023)   Social Connection and Isolation Panel [NHANES]     Frequency of Communication with Friends and Family: More than three times a week    Frequency of Social Gatherings with Friends and Family: Not on file    Attends Religious Services: Not on file    Active Member of Clubs or Organizations: Not on file    Attends Banker Meetings: Not on file    Marital Status: Married  Intimate Partner Violence: Not At Risk (09/15/2023)   Humiliation, Afraid, Rape, and Kick questionnaire    Fear of Current or Ex-Partner: No    Emotionally Abused: No    Physically Abused: No    Sexually Abused: No    Family History  Problem Relation Age of Onset   Diabetes Mother    Breast cancer Mother    Heart attack Father 69   Breast cancer Sister    CVA Maternal Grandmother 71   Colon cancer Maternal Uncle    Melanoma Daughter     Medications Prior to Admission  Medication Sig Dispense Refill Last Dose/Taking   acetaminophen (TYLENOL) 500 MG tablet Take 1,000 mg by mouth every 6 (six) hours as needed for moderate pain.      cetirizine (ZYRTEC) 10 MG tablet Take 10 mg by mouth daily as needed for allergies.      famotidine (PEPCID) 20 MG tablet Take 20 mg by mouth daily as needed for heartburn or indigestion.      fluticasone (FLONASE) 50 MCG/ACT nasal spray Place 2 sprays into both nostrils daily as needed for allergies or rhinitis.      hydrocortisone (ANUSOL-HC) 2.5 % rectal cream Place 1 Application rectally 2 (two) times daily as needed for hemorrhoids or anal itching.      methocarbamol (ROBAXIN) 500 MG tablet Take 1 tablet (500 mg total) by mouth every 6 (six) hours as needed for muscle spasms. 30 tablet 1    Multiple Vitamins-Minerals (MENS 50+ MULTI VITAMIN/MIN) TABS Take 1 tablet by mouth daily.      naproxen sodium (ALEVE) 220 MG tablet Take 220 mg by mouth daily as needed (pain).      oxyCODONE (OXY IR/ROXICODONE) 5 MG immediate release tablet Take 1-2 tablets (5-10 mg total) by mouth every 6 (six) hours as needed for moderate pain (pain  score 4-6). 30 tablet 0    psyllium (METAMUCIL) 58.6 % packet Take 1 packet by mouth daily as needed (constipation).      valACYclovir (VALTREX) 500 MG tablet Take 500 mg by mouth 2 (two) times daily as needed.       Current Facility-Administered Medications  Medication Dose Route Frequency Provider Last Rate Last Admin   acetaminophen (TYLENOL) tablet 650 mg  650 mg Oral Q6H PRN Candyce Champagne, MD   650 mg at 09/14/23 1833   Or   acetaminophen (TYLENOL) suppository 650 mg  650 mg Rectal Q6H PRN Candyce Champagne, MD       bisacodyl (DULCOLAX) suppository 10  mg  10 mg Rectal Q12H PRN Candyce Champagne, MD       bupivacaine liposome (EXPAREL) 1.3 % injection 266 mg  20 mL Infiltration On Call to OR Candyce Champagne, MD       Chlorhexidine Gluconate Cloth 2 % PADS 6 each  6 each Topical Once Candyce Champagne, MD       diphenhydrAMINE (BENADRYL) 12.5 MG/5ML elixir 12.5 mg  12.5 mg Oral Q6H PRN Candyce Champagne, MD       Or   diphenhydrAMINE (BENADRYL) injection 12.5 mg  12.5 mg Intravenous Q6H PRN Candyce Champagne, MD       famotidine (PEPCID) tablet 20 mg  20 mg Oral Daily PRN Candyce Champagne, MD       fluticasone (FLONASE) 50 MCG/ACT nasal spray 2 spray  2 spray Each Nare Daily PRN Candyce Champagne, MD       gabapentin (NEURONTIN) capsule 200 mg  200 mg Oral On Call to OR Candyce Champagne, MD       heparin injection 5,000 Units  5,000 Units Subcutaneous Q8H Shakeyla Giebler, MD   5,000 Units at 09/14/23 1832   hydrocortisone (ANUSOL-HC) 2.5 % rectal cream 1 Application  1 Application Rectal BID PRN Candyce Champagne, MD       HYDROmorphone (DILAUDID) injection 0.5-2 mg  0.5-2 mg Intravenous Q2H PRN Candyce Champagne, MD       lactated ringers bolus 1,000 mL  1,000 mL Intravenous Q8H PRN Candyce Champagne, MD       lactated ringers infusion   Intravenous Continuous Candyce Champagne, MD 100 mL/hr at 09/14/23 2337 New Bag at 09/14/23 2337   loratadine (CLARITIN) tablet 10 mg  10 mg Oral Daily Candyce Champagne, MD   10 mg at 09/14/23 1833    magic mouthwash  15 mL Oral QID PRN Candyce Champagne, MD       menthol-cetylpyridinium (CEPACOL) lozenge 3 mg  1 lozenge Oral PRN Candyce Champagne, MD       methocarbamol (ROBAXIN) injection 1,000 mg  1,000 mg Intravenous Q6H PRN Candyce Champagne, MD       methocarbamol (ROBAXIN) tablet 1,000 mg  1,000 mg Oral Q6H PRN Candyce Champagne, MD       metoprolol tartrate (LOPRESSOR) injection 5 mg  5 mg Intravenous Q6H PRN Candyce Champagne, MD       multivitamin with minerals tablet 1 tablet  1 tablet Oral Daily Candyce Champagne, MD   1 tablet at 09/14/23 1833   naphazoline-glycerin (CLEAR EYES REDNESS) ophth solution 1-2 drop  1-2 drop Both Eyes QID PRN Candyce Champagne, MD       naproxen (NAPROSYN) tablet 250 mg  250 mg Oral Daily PRN Arlyne Bering T, RPH       ondansetron (ZOFRAN-ODT) disintegrating tablet 4 mg  4 mg Oral Q6H PRN Candyce Champagne, MD       Or   ondansetron (ZOFRAN) injection 4 mg  4 mg Intravenous Q6H PRN Candyce Champagne, MD       phenol (CHLORASEPTIC) mouth spray 2 spray  2 spray Mouth/Throat PRN Candyce Champagne, MD       piperacillin-tazobactam (ZOSYN) IVPB 3.375 g  3.375 g Intravenous Q8H Jashaun Penrose, MD 12.5 mL/hr at 09/15/23 0244 3.375 g at 09/15/23 0244   polycarbophil (FIBERCON) tablet 625 mg  625 mg Oral BID Candyce Champagne, MD   625 mg at 09/14/23 2057   prochlorperazine (COMPAZINE) tablet 10 mg  10 mg Oral Q6H PRN Candyce Champagne, MD       Or  prochlorperazine (COMPAZINE) injection 5-10 mg  5-10 mg Intravenous Q6H PRN Candyce Champagne, MD       simethicone (MYLICON) chewable tablet 40 mg  40 mg Oral Q6H PRN Candyce Champagne, MD   40 mg at 09/14/23 1833   sodium chloride (OCEAN) 0.65 % nasal spray 1-2 spray  1-2 spray Each Nare Q6H PRN Candyce Champagne, MD         No Known Allergies  BP 122/65 (BP Location: Left Arm)   Pulse 62   Temp 98.7 F (37.1 C)   Resp 16   Ht 5\' 10"  (1.778 m)   Wt 90.7 kg   SpO2 93%   BMI 28.70 kg/m   Labs: Results for orders placed or performed during the hospital  encounter of 09/14/23 (from the past 48 hours)  Lipase, blood     Status: None   Collection Time: 09/14/23  8:34 AM  Result Value Ref Range   Lipase 33 11 - 51 U/L    Comment: Performed at [facility], [address], [place], [place] [address]  Comprehensive metabolic panel     Status: Abnormal   Collection Time: 09/14/23  8:34 AM  Result Value Ref Range   Sodium 136 135 - 145 mmol/L   Potassium 4.1 3.5 - 5.1 mmol/L   Chloride 103 98 - 111 mmol/L   CO2 25 22 - 32 mmol/L   Glucose, Bld 122 (H) 70 - 99 mg/dL    Comment: Glucose reference range applies only to samples taken after fasting for at least 8 hours.   BUN 13 8 - 23 mg/dL   Creatinine, Ser 6.04 0.61 - 1.24 mg/dL   Calcium 9.3 8.9 - 54.0 mg/dL   Total Protein 7.2 6.5 - 8.1 g/dL   Albumin 4.1 3.5 - 5.0 g/dL   AST 27 15 - 41 U/L   ALT 39 0 - 44 U/L   Alkaline Phosphatase 43 38 - 126 U/L   Total Bilirubin 0.8 0.0 - 1.2 mg/dL   GFR, Estimated >98 >11 mL/min    Comment: (NOTE) Calculated using the CKD-EPI Creatinine Equation (2021)    Anion gap 8 5 - 15    Comment: Performed at [facility], [address] [place], [place] [address]  CBC     Status: Abnormal   Collection Time: 09/14/23  8:34 AM  Result Value Ref Range   WBC 11.8 (H) 4.0 - 10.5 K/uL   RBC 4.92 4.22 - 5.81 MIL/uL   Hemoglobin 14.9 13.0 - 17.0 g/dL   HCT 29.5 62.1 - 30.8 %   MCV 91.7 80.0 - 100.0 fL   MCH 30.3 26.0 - 34.0 pg   MCHC 33.0 30.0 - 36.0 g/dL   RDW 65.7 84.6 - 96.2 %   Platelets 222 150 - 400 K/uL   nRBC 0.0 0.0 - 0.2 %    Comment: Performed at [facility], [address], [place], [place] [address]  Differential     Status: Abnormal   Collection Time: 09/14/23  8:34 AM  Result Value Ref Range   Neutrophils Relative % 78 %   Neutro Abs 9.0 (H) 1.7 - 7.7 K/uL   Lymphocytes Relative 13 %   Lymphs Abs 1.5 0.7 - 4.0 K/uL   Monocytes Relative 7 %   Monocytes Absolute 0.9 0.1 - 1.0 K/uL    Eosinophils Relative 1 %   Eosinophils Absolute 0.1 0.0 - 0.5 K/uL   Basophils Relative 1 %  Basophils Absolute 0.1 0.0 - 0.1 K/uL   Immature Granulocytes 0 %   Abs Immature Granulocytes 0.05 0.00 - 0.07 K/uL    Comment: Performed at [facility], [address], [place], [place] [address]  Urinalysis, Routine w reflex microscopic -Urine, Random     Status: Abnormal   Collection Time: 09/14/23  8:49 AM  Result Value Ref Range   Color, Urine YELLOW YELLOW   APPearance CLEAR CLEAR   Specific Gravity, Urine 1.014 1.005 - 1.030   pH 5.0 5.0 - 8.0   Glucose, UA NEGATIVE NEGATIVE mg/dL   Hgb urine dipstick SMALL (A) NEGATIVE   Bilirubin Urine NEGATIVE NEGATIVE   Ketones, ur NEGATIVE NEGATIVE mg/dL   Protein, ur NEGATIVE NEGATIVE mg/dL   Nitrite NEGATIVE NEGATIVE   Leukocytes,Ua NEGATIVE NEGATIVE   RBC / HPF 0-5 0 - 5 RBC/hpf   WBC, UA 0-5 0 - 5 WBC/hpf   Bacteria, UA NONE SEEN NONE SEEN   Squamous Epithelial / HPF 0-5 0 - 5 /HPF   Mucus PRESENT     Comment: Performed at [facility], [address] [place], [place] [address]  Hepatic function panel     Status: None   Collection Time: 09/15/23  5:20 AM  Result Value Ref Range   Total Protein 6.5 6.5 - 8.1 g/dL   Albumin 3.5 3.5 - 5.0 g/dL   AST 22 15 - 41 U/L   ALT 29 0 - 44 U/L   Alkaline Phosphatase 41 38 - 126 U/L   Total Bilirubin 1.1 0.0 - 1.2 mg/dL   Bilirubin, Direct 0.2 0.0 - 0.2 mg/dL   Indirect Bilirubin 0.9 0.3 - 0.9 mg/dL    Comment: Performed at [facility], [address], [place], [place] [address]  Surgical PCR screen     Status: None   Collection Time: 09/15/23  5:48 AM   Specimen: Nasal Mucosa; Nasal Swab  Result Value Ref Range   MRSA, PCR NEGATIVE NEGATIVE   Staphylococcus aureus NEGATIVE NEGATIVE    Comment: (NOTE) The Xpert SA Assay (FDA approved for NASAL specimens in patients 46 years of age and older), is one component of a  comprehensive surveillance program. It is not intended to diagnose infection nor to guide or monitor treatment. Performed at [facility], [address] [place], [place] [address]     Imaging / Studies: MR ABDOMEN MRCP W WO CONTAST Result Date: 09/14/2023 CLINICAL DATA:  RUQ abdominal pain, biliary disease suspected, US  nondiagnostic; 144615 Pain 144615 EXAM: MRI ABDOMEN WITHOUT AND WITH CONTRAST (INCLUDING MRCP) TECHNIQUE: Multiplanar multisequence MR imaging of the abdomen was performed both before and after the administration of intravenous contrast. Heavily T2-weighted images of the biliary and pancreatic ducts were obtained, and three-dimensional MRCP images were rendered by post processing. CONTRAST:  9mL GADAVIST GADOBUTROL 1 MMOL/ML IV SOLN COMPARISON:  Ultrasound and CT scan abdomen from earlier the same day. FINDINGS: Lower chest: Unremarkable MR appearance to the lung bases. No pleural effusion. No pericardial effusion. Normal heart size. Hepatobiliary: The liver is normal in size and configuration. There is marked diffuse hepatic steatosis. No intrahepatic or extrahepatic bile duct dilatation. No choledocholithiasis. The gallbladder is physiologically distended (diameter up to 3.5 cm). There is mild-to-moderate diffuse gallbladder wall thickening/edema and mild-to-moderate pericholecystic fat stranding. There is also gallbladder mucosal hyperenhancement. Small to moderate volume dependent gallstones and sludge noted. There is lobulation along the fundus of the gallbladder which is of unknown etiology.  Differential diagnosis includes xanthogranulomatous cholecystitis. Pancreas: No mass, inflammatory changes or other parenchymal abnormality identified. No main pancreatic duct dilation. Spleen:  Within normal limits in size and appearance. No focal mass. Adrenals/Urinary Tract: Unremarkable adrenal glands. No hydroureteronephrosis. No suspicious renal mass. Stomach/Bowel:  Visualized portions within the abdomen are unremarkable. No disproportionate dilation of bowel loops. Unremarkable appendix. Vascular/Lymphatic: No pathologically enlarged lymph nodes identified. No abdominal aortic aneurysm demonstrated. No ascites. Other:  None. Musculoskeletal: No suspicious bone lesions identified. IMPRESSION: 1. Findings favor acute cholecystitis. There is lobulation along the fundus of the gallbladder which is of unknown etiology. Differential diagnosis includes xanthogranulomatous cholecystitis. 2. No intrahepatic or extrahepatic bile duct dilation. No choledocholithiasis. 3. Multiple other nonacute observations, as described above. Electronically Signed   By: Jules Schick M.D.   On: 09/14/2023 16:13   MR 3D Recon At Scanner Result Date: 09/14/2023 CLINICAL DATA:  RUQ abdominal pain, biliary disease suspected, [place] nondiagnostic; 161096 Pain 144615 EXAM: MRI ABDOMEN WITHOUT AND WITH CONTRAST (INCLUDING MRCP) TECHNIQUE: Multiplanar multisequence MR imaging of the abdomen was performed both before and after the administration of intravenous contrast. Heavily T2-weighted images of the biliary and pancreatic ducts were obtained, and three-dimensional MRCP images were rendered by post processing. CONTRAST:  9mL GADAVIST GADOBUTROL 1 MMOL/ML IV SOLN COMPARISON:  Ultrasound and CT scan abdomen from earlier the same day. FINDINGS: Lower chest: Unremarkable MR appearance to the lung bases. No pleural effusion. No pericardial effusion. Normal heart size. Hepatobiliary: The liver is normal in size and configuration. There is marked diffuse hepatic steatosis. No intrahepatic or extrahepatic bile duct dilatation. No choledocholithiasis. The gallbladder is physiologically distended (diameter up to 3.5 cm). There is mild-to-moderate diffuse gallbladder wall thickening/edema and mild-to-moderate pericholecystic fat stranding. There is also gallbladder mucosal hyperenhancement. Small to moderate volume  dependent gallstones and sludge noted. There is lobulation along the fundus of the gallbladder which is of unknown etiology. Differential diagnosis includes xanthogranulomatous cholecystitis. Pancreas: No mass, inflammatory changes or other parenchymal abnormality identified. No main pancreatic duct dilation. Spleen:  Within normal limits in size and appearance. No focal mass. Adrenals/Urinary Tract: Unremarkable adrenal glands. No hydroureteronephrosis. No suspicious renal mass. Stomach/Bowel: Visualized portions within the abdomen are unremarkable. No disproportionate dilation of bowel loops. Unremarkable appendix. Vascular/Lymphatic: No pathologically enlarged lymph nodes identified. No abdominal aortic aneurysm demonstrated. No ascites. Other:  None. Musculoskeletal: No suspicious bone lesions identified. IMPRESSION: 1. Findings favor acute cholecystitis. There is lobulation along the fundus of the gallbladder which is of unknown etiology. Differential diagnosis includes xanthogranulomatous cholecystitis. 2. No intrahepatic or extrahepatic bile duct dilation. No choledocholithiasis. 3. Multiple other nonacute observations, as described above. Electronically Signed   By: Jules Schick M.D.   On: 09/14/2023 16:13   US Abdomen Limited RUQ (LIVER/GB) Result Date: 09/14/2023 CLINICAL DATA:  Right lower quadrant pain EXAM: ULTRASOUND ABDOMEN LIMITED RIGHT UPPER QUADRANT COMPARISON:  CT earlier 09/14/2023. FINDINGS: Gallbladder: Irregular contour to the distended gallbladder. There is a rounded structure within the gallbladder lumen measuring 14 mm but this is not shadow. Diffuse wall thickening. No Murphy's sign reported. Common bile duct: Diameter: 3 mm Liver: Diffusely echogenic hepatic parenchyma consistent with fatty liver infiltration. Portal vein is patent on color Doppler imaging with normal direction of blood flow towards the liver. Other: None. IMPRESSION: Irregular contour to the distended gallbladder  with wall thickening. There is also echogenic lobular area in the gallbladder without clear shadowing measuring 14 mm. There are some separate small stones. This would have a differential  including an and acute infectious or inflammatory process versus a more aggressive process such as a neoplastic process. Recommend further evaluation with MRI with and without contrast when appropriate dynamic Electronically Signed   By: Adrianna Horde M.D.   On: 09/14/2023 11:39   CT ABDOMEN PELVIS W CONTRAST Result Date: 09/14/2023 CLINICAL DATA:  Abdominal pain, worsening yesterday and last night. Epigastric centered. EXAM: CT ABDOMEN AND PELVIS WITH CONTRAST TECHNIQUE: Multidetector CT imaging of the abdomen and pelvis was performed using the standard protocol following bolus administration of intravenous contrast. RADIATION DOSE REDUCTION: This exam was performed according to the departmental dose-optimization program which includes automated exposure control, adjustment of the mA and/or kV according to patient size and/or use of iterative reconstruction technique. CONTRAST:  OMNIPAQUE IOHEXOL 300 MG/ML  SOLN COMPARISON:  03/15/2022 FINDINGS: Lower chest: Posterior lingular scarring. Normal heart size without pericardial or pleural effusion. Hepatobiliary: Mild hepatic steatosis with sparing adjacent the gallbladder. Subcentimeter posterior segment 2-3 low-density lesion is likely a cyst. Suspect edema adjacent the gallbladder neck including on 22/11. No calcified stone or biliary duct dilatation. Pancreas: Normal, without mass or ductal dilatation. Spleen: Normal in size, without focal abnormality. Adrenals/Urinary Tract: Normal adrenal glands. Normal kidneys, without hydronephrosis. Normal urinary bladder. Degraded evaluation of the pelvis, secondary to beam hardening artifact from left hip arthroplasty. Stomach/Bowel: Normal stomach, without wall thickening. Normal colon, appendix, and terminal ileum. Normal small  bowel. Vascular/Lymphatic: Aortic atherosclerosis. No abdominopelvic adenopathy. Reproductive: Normal prostate for age. Other: No significant free fluid. No free intraperitoneal air. Small fat containing paraumbilical hernia. Tiny fat containing right inguinal hernia. Musculoskeletal: left hip arthroplasty. Mild right hip osteoarthritis. L4-5 and L5-S1 disc bulges IMPRESSION: 1. Findings suspicious for acute cholecystitis. Correlate with right upper quadrant symptoms and consider ultrasound. 2. No other explanation for patient's symptoms; normal appendix. 3.  Aortic Atherosclerosis (ICD10-I70.0). Electronically Signed   By: Lore Rode M.D.   On: 09/14/2023 10:25   XR HIP UNILAT W OR W/O PELVIS 1V LEFT Result Date: 08/22/2023 An AP pelvis and lateral left hip shows a well-seated total hip arthroplasty on the left side.    Eddye Goodie, M.D., F.A.C.S. Gastrointestinal and Minimally Invasive Surgery [facility] [address] [place], [place] [address] [phone] Main / Paging  09/15/2023 9:35 AM    Eddye Goodie

## 2023-09-15 NOTE — Op Note (Addendum)
 09/15/2023  PATIENT:  David Dillon  73 y.o. male  Patient Care Team: Ilsa Maltese, PA as PCP - General (Physician Assistant) Arnie Lao, MD as Consulting Physician (Orthopedic Surgery) Avanell Leigh, MD as Consulting Physician (Cardiology) Baldo Bonds, MD as Consulting Physician (Gastroenterology)  PRE-OPERATIVE DIAGNOSIS:    Acute on Chronic Calculus Cholecystitis Incarcerated umbilical hernia 2 x 2 cm  POST-OPERATIVE DIAGNOSIS:   Acute on Chronic Calculus Cholecystitis Incarcerated umbilical hernia 2 x 2 cm  PROCEDURE:  SINGLE SITE Laparoscopic cholecystectomy (CPT code 16109) Primary umbilical hernia repair  SURGEON:  Eddye Goodie, MD, FACS.  ASSISTANT:  Fernande Howells, MD  An experienced assistant was required given the standard of surgical care given the complexity of the case.  This assistant was needed for exposure, dissection, suction, tissue approximation, retraction, perception, etc  ANESTHESIA:  General endotracheal intubation anesthesia (GETA) and Local & regional field block at incision(s) for perioperative & postoperative pain control provided with liposomal bupivacaine  (Experel) 20mL mixed with 30mL of bupivicaine 0.25% with epinephrine   Estimated Blood Loss (EBL):   No intake/output data recorded..   (See anesthesia record)  Delay start of Pharmacological VTE agent (>24hrs) due to concerns of significant anemia, surgical blood loss, or risk of bleeding?:  no  DRAINS: (None)  SPECIMEN:  Gallbladder  DISPOSITION OF SPECIMEN:  Pathology  COUNTS:  Sponge, needle, & instrument counts CORRECT  PLAN OF CARE: Admit to inpatient   PATIENT DISPOSITION:  PACU - hemodynamically stable.  INDICATION: 73 year old male with intermittent abdominal pains for the past 2 years but more constant episode with nausea and near emesis.  Upper abdominal pain especially right upper quadrant.  CAT scan concerning for inflamed gallbladder with atypical  wall.  MRCP suspicious for cholecystitis and no evidence of any malignancy.  Patient admitted on IV antibiotics.  Recommendation made for laparoscopic cholecystectomy  The anatomy & physiology of hepatobiliary & pancreatic function was discussed.  The pathophysiology of gallbladder dysfunction was discussed.  Natural history risks without surgery was discussed.   I feel the risks of no intervention will lead to serious problems that outweigh the operative risks; therefore, I recommended cholecystectomy to remove the pathology.  I explained laparoscopic techniques with possible need for an open approach.  Probable cholangiogram to evaluate the bilary tract was explained as well.    Risks such as bleeding, infection, abscess, leak, injury to other organs, need for further treatment, heart attack, death, and other risks were discussed.  I noted a good likelihood this will help address the problem.  Possibility that this will not correct all abdominal symptoms was explained.  Goals of post-operative recovery were discussed as well.  We will work to minimize complications.  An educational handout further explaining the pathology and treatment options was given as well.  Questions were answered.  The patient expresses understanding & wishes to proceed with surgery.  OR FINDINGS: Shrunken very thickened gallbladder rather from parotic with dense mesocolon and omental adhesions consistent with acute on chronic cholecystitis.  Filled with numerous small cuboid BB sized stones.  Very foreshortened cystic duct = no cholangiogram done.  Liver: Fatty steatohepatitis  DESCRIPTION:   The patient was identified & brought in the operating room. The patient was positioned supine with arms tucked. SCDs were active during the entire case. The patient underwent general anesthesia without any difficulty.  The abdomen was prepped and draped in a sterile fashion. A Surgical Timeout confirmed our plan.  I made a transverse  curvilinear incision through the superior umbilical fold.  I placed a 5mm long port through the supraumbilical fascia using a modified Hassan cutdown technique with umbilical stalk fascial countertraction. I began carbon dioxide insufflation.  No change in end tidal CO2 measurement.   Camera inspection revealed no injury. There were no adhesions to the anterior abdominal wall supraumbilically.  I proceeded to continue with single site technique. I placed a #5 port in left upper aspect of the wound. I placed a 5 mm atraumatic grasper in the right inferior aspect of the wound.  I turned attention to the right upper quadrant.  Operative findings as noted above.  Freed greater omentum off the liver edge using harmonic dissection to expose the dome of the gallbladder.  The gallbladder fundus was elevated cephalad. I freed adhesions to the ventral surface of the gallbladder off carefully.  I freed the peritoneal coverings between the gallbladder and the liver on the posteriolateral and anteriomedial walls. I alternated between Harmonic & blunt [place]  dissection to help get a good critical view of the cystic artery and cystic duct.  This was a challenge given the chronic inflammation so I began to completely mobilize the gallbladder over the liver and a modified dome down approach.  With that I could get better mobility to free allof the gallbladder off the liver bed to get a good critical view of the infundibulum and cystic duct. I dissected out the cystic artery; and, after getting a good 360 view, ligated the anterior & posterior branches of the cystic artery close on the infundibulum using the Harmonic ultrasonic dissection.  Continued dissection skeletonization until I could identify the infundibulum and only one connection tween the gallbladder and the porta hepatis consistent with the cystic duct.  Did circumferential dissection skeletonized.  Very foreshortened.  I ligated the cystic duct using a 0 PDS  Endoloop x 2.  Transected the infundibulum off the shortened cystic duct.  Some spillage of stones.  Gallbladder placed in EcoSac.  Meticulously placed stones in the EcoSac bag and tied it down.  Did copious irrigation of several liters and ensured there were no more remaining stones.  Assured hemostasis on the liver bed which was rather inflamed with some steatohepatitis's but after irrigation of another liter and sure things were good with no stones.  We removed the gallbladder out the supraumbilical fascia umbilical hernia. I closed the fascia at the umbilical hernia transversely using #1 PDS interrupted stitches. I closed the skin using 4-0 monocryl stitch.  Sterile dressing was applied. The patient was extubated & arrived in the PACU in stable condition..  I had discussed postoperative care with the patient in the holding area. I discussed operative findings, updated the patient's status, discussed probable steps to recovery, and gave postoperative recommendations to the patient's spouse,   Recommendations were made.  Questions were answered.  She expressed understanding & appreciation.  Eddye Goodie, M.D., F.A.C.S. Gastrointestinal and Minimally Invasive Surgery [facility] [address], [address] [place], [place] [address] [phone] Main / Paging  09/15/2023 11:40 AM

## 2023-09-15 NOTE — Discharge Instructions (Signed)
 ################################################################  LAPAROSCOPIC SURGERY: POST OP INSTRUCTIONS  ######################################################################  EAT Gradually transition to a high fiber diet with a fiber supplement over the next few weeks after discharge.  Start with a pureed / full liquid diet (see below)  WALK Walk an hour a day.  Control your pain to do that.    CONTROL PAIN Control pain so that you can walk, sleep, tolerate sneezing/coughing, go up/down stairs.  HAVE A BOWEL MOVEMENT DAILY Keep your bowels regular to avoid problems.  OK to try a laxative to override constipation.  OK to use an antidairrheal to slow down diarrhea.  Call if not better after 2 tries  CALL IF YOU HAVE PROBLEMS/CONCERNS Call if you are still struggling despite following these instructions. Call if you have concerns not answered by these instructions  ######################################################################    DIET: Follow a light bland diet & liquids the first 24 hours after arrival home, such as soup, liquids, starches, etc.  Be sure to drink plenty of fluids.  Quickly advance to a usual solid diet within a few days.  Avoid fast food or heavy meals as your are more likely to get nauseated or have irregular bowels.  A low-fat, high-fiber diet for the rest of your life is ideal.  Take your usually prescribed home medications unless otherwise directed. Blood thinners:  You can restart any strong blood thinners after the second postoperative day  for example: COUMADIN (warfarin), XERELTO (rivaroxaban), ELIQUIS (apixaban), PLAVIX (clopidigrel), BRILINTA (ticagrelor), EFFIENT (prasugrel), PRADAXA (dabigatran), etc  Continue aspirin before & after surgery..     Some oozing/bleeding the first 1-2 weeks is common but should taper down & be small volume.    If you are passing many large clots or having uncontrolling bleeding, call your surgeon  PAIN  CONTROL: Pain is best controlled by a usual combination of three different methods TOGETHER: Ice/Heat Over the counter pain medication Prescription pain medication Most patients will experience some swelling and bruising around the incisions.  Ice packs or heating pads (30-60 minutes up to 6 times a day) will help. Use ice for the first few days to help decrease swelling and bruising, then switch to heat to help relax tight/sore spots and speed recovery.  Some people prefer to use ice alone, heat alone, alternating between ice & heat.  Experiment to what works for you.  Swelling and bruising can take several weeks to resolve.   It is helpful to take an over-the-counter pain medication regularly for the first few weeks.  Choose one of the following that works best for you: Naproxen (Aleve, etc)  Two 220mg  tabs twice a day Ibuprofen (Advil, etc) Three 200mg  tabs four times a day (every meal & bedtime) Acetaminophen (Tylenol, etc) 500-650mg  four times a day (every meal & bedtime) A  prescription for pain medication (such as oxycodone, hydrocodone, tramadol, gabapentin, methocarbamol, etc) should be given to you upon discharge.  Take your pain medication as prescribed.  If you are having problems/concerns with the prescription medicine (does not control pain, nausea, vomiting, rash, itching, etc), please call us [phone] to see if we need to switch you to a different pain medicine that will work better for you and/or control your side effect better. If you need a refill on your pain medication, please give [place] 48 hour notice.  contact your pharmacy.  They will contact our office to request authorization. Prescriptions will not be filled after 5 pm or on week-ends  AVOID GETTING CONSTIPATED.   a.  Between the surgery and the pain medications, it is common to experience some constipation.  b.  Drink plenty of liquids c   ake a fiber supplement 2 times day (such as Metamucil, Citrucel, FiberCon,  MiraLax, etc) to have a bowel movement every day. d.  If you have not had a BM by 2 days after surgery: -drink liquids only until you have a bowel movement - take MiraLAX 2 doses every 2 hours until you have a bowel movement   Watch out for diarrhea.   If you have many loose bowel movements, simplify your diet to bland foods & liquids for a few days.   Stop any stool softeners and decrease your fiber supplement.   Switching to mild anti-diarrheal medications (Kayopectate, Pepto Bismol) can help.   If this worsens or does not improve, please call us.  Wash / shower every day.  You may shower over the dressings as they are waterproof.  Continue to shower over incision(s) after the dressing is off.  It is good for closed incisions and even open wounds to be washed every day.  Shower every day.  Short baths are fine.  Wash the incisions and wounds clean with soap & water.    You may leave closed incisions open to air if it is dry.   You may cover the incision with clean gauze & replace it after your daily shower for comfort.  TEGADERM:  You have clear gauze band-aid dressings over your closed incision(s).  Remove the dressings 2 days after surgery = 4/14.    ACTIVITIES as tolerated:   You may resume regular (light) daily activities beginning the next day--such as daily self-care, walking, climbing stairs--gradually increasing activities as tolerated.  If you can walk 30 minutes without difficulty, it is safe to try more intense activity such as jogging, treadmill, bicycling, low-impact aerobics, swimming, etc. Save the most intensive and strenuous activity for last such as sit-ups, heavy lifting, contact sports, etc  Refrain from any heavy lifting or straining until you are off narcotics for pain control.   DO NOT PUSH THROUGH PAIN.  Let pain be your guide: If it hurts to do something, don't do it.  Pain is your body warning you to avoid that activity for another week until the pain goes  down. You may drive when you are no longer taking prescription pain medication, you can comfortably wear a seatbelt, and you can safely maneuver your car and apply brakes. You may have sexual intercourse when it is comfortable.  FOLLOW UP in our office Please call [facility] at [phone] to set up an appointment to see your surgeon in the office for a follow-up appointment approximately 2-3 weeks after your surgery. Make sure that you call for this appointment the day you arrive home to insure a convenient appointment time.  10. IF YOU HAVE DISABILITY OR FAMILY LEAVE FORMS, BRING THEM TO THE OFFICE FOR PROCESSING.  DO NOT GIVE THEM TO YOUR DOCTOR.   WHEN TO CALL us [phone]: Poor pain control Reactions / problems with new medications (rash/itching, nausea, etc)  Fever over 101.5 F (38.5 C) Inability to urinate Nausea and/or vomiting Worsening swelling or bruising Continued bleeding from incision. Increased pain, redness, or drainage from the incision   The clinic staff is available to answer your questions during regular business hours (8:30am-5pm).  Please don't hesitate to call and ask to speak to one of our nurses for clinical concerns.   If you have a  medical emergency, go to the nearest emergency room or call 911.  A surgeon from [facility] is always on call at the [facility], [place] [address], [address], [place], [place]  [address] ? MAIN: [phone] ? TOLL FREE: [phone] ?  FAX [phone] [email or web address]  ##############################################################

## 2023-09-15 NOTE — Anesthesia Preprocedure Evaluation (Signed)
 Anesthesia Evaluation  Patient identified by MRN, date of birth, ID band Patient awake    Reviewed: Allergy & Precautions, NPO status , Patient's Chart, lab work & pertinent test results  History of Anesthesia Complications Negative for: history of anesthetic complications  Airway Mallampati: II  TM Distance: >3 FB Neck ROM: Full    Dental  (+) Dental Advisory Given   Pulmonary neg shortness of breath, neg sleep apnea, neg COPD, neg recent URI, former smoker   breath sounds clear to auscultation       Cardiovascular negative cardio ROS  Rhythm:Regular     Neuro/Psych neg Seizures    GI/Hepatic ,GERD  ,,Cholecystitis  Lab Results      Component                Value               Date                      ALT                      29                  09/15/2023                AST                      22                  09/15/2023                ALKPHOS                  41                  09/15/2023                BILITOT                  1.1                 09/15/2023              Endo/Other  negative endocrine ROS    Renal/GU negative Renal ROS     Musculoskeletal  (+) Arthritis ,    Abdominal   Peds  Hematology negative hematology ROS (+)   Anesthesia Other Findings   Reproductive/Obstetrics                              Anesthesia Physical Anesthesia Plan  ASA: 2  Anesthesia Plan: General   Post-op Pain Management: Tylenol PO (pre-op)*   Induction: Intravenous  PONV Risk Score and Plan: 3 and Dexamethasone and Ondansetron  Airway Management Planned: Oral ETT  Additional Equipment: None  Intra-op Plan:   Post-operative Plan: Extubation in OR  Informed Consent: I have reviewed the patients History and Physical, chart, labs and discussed the procedure including the risks, benefits and alternatives for the proposed anesthesia with the patient or authorized representative  who has indicated his/her understanding and acceptance.     Dental advisory given  Plan Discussed with: CRNA  Anesthesia Plan Comments:          Anesthesia Quick Evaluation

## 2023-09-15 NOTE — Transfer of Care (Signed)
 Immediate Anesthesia Transfer of Care Note  Patient: David Dillon  Procedure(s) Performed: LAPAROSCOPIC CHOLECYSTECTOMY SINGLE SITE  Patient Location: PACU  Anesthesia Type:General  Level of Consciousness: awake, alert , and oriented  Airway & Oxygen Therapy: Patient Spontanous Breathing and Patient connected to nasal cannula oxygen  Post-op Assessment: Report given to RN and Post -op Vital signs reviewed and stable  Post vital signs: Reviewed and stable  Last Vitals:  Vitals Value Taken Time  BP 128/95 09/15/23 1145  Temp 36.8 1145  Pulse 68 1145  Resp 20 09/15/23 1145  SpO2    Vitals shown include unfiled device data.  Last Pain:  Vitals:   09/15/23 0527  TempSrc: Oral  PainSc:          Complications: No notable events documented.

## 2023-09-15 NOTE — Anesthesia Postprocedure Evaluation (Signed)
 Anesthesia Post Note  Patient: David Dillon  Procedure(s) Performed: LAPAROSCOPIC CHOLECYSTECTOMY SINGLE SITE     Patient location during evaluation: PACU Anesthesia Type: General Level of consciousness: awake and alert Pain management: pain level controlled Vital Signs Assessment: post-procedure vital signs reviewed and stable Respiratory status: spontaneous breathing, nonlabored ventilation and respiratory function stable Cardiovascular status: blood pressure returned to baseline and stable Postop Assessment: no apparent nausea or vomiting Anesthetic complications: no   No notable events documented.  Last Vitals:  Vitals:   09/15/23 1230 09/15/23 1233  BP: (!) 158/69   Pulse: 60   Resp: 14   Temp:  37.1 C  SpO2: 90%     Last Pain:  Vitals:   09/15/23 1233  TempSrc:   PainSc: 4                  Jailey Booton

## 2023-09-16 ENCOUNTER — Encounter (HOSPITAL_COMMUNITY): Payer: Self-pay | Admitting: Surgery

## 2023-09-16 NOTE — Discharge Summary (Signed)
 Physician Discharge Summary    David Dillon MRN: [id number] DOB/AGE: [birth year] = 73 y.o.  Patient Care Team: Ilsa Maltese, PA as PCP - General (Physician Assistant) Arnie Lao, MD as Consulting Physician (Orthopedic Surgery) Avanell Leigh, MD as Consulting Physician (Cardiology) Baldo Bonds, MD as Consulting Physician (Gastroenterology)  Admit date: 09/14/2023  Discharge date: 09/16/2023  Hospital Stay = 1 days    Discharge Diagnoses:  Principal Problem:   Acute calculous cholecystitis Active Problems:   Allergic rhinitis   Gastroesophageal reflux disease without esophagitis   History of colonic polyps   1 Day Post-Op  09/15/2023  POST-OPERATIVE DIAGNOSIS:   cholecystitis  SURGERY:  09/15/2023  Procedure(s): LAPAROSCOPIC CHOLECYSTECTOMY SINGLE SITE  SURGEON:    Surgeon(s): Candyce Champagne, MD  Consults: Anesthesia  Hospital Course:   The patient underwent the surgery above.  Postoperatively, the patient gradually mobilized and advanced to a solid diet.  Pain and other symptoms were treated aggressively.    By the time of discharge, the patient was walking well the hallways, eating food, having flatus.  Pain was well-controlled on an oral medications.  Based on meeting discharge criteria and continuing to recover, I felt it was safe for the patient to be discharged from the hospital to further recover with close followup. Postoperative recommendations were discussed in detail.  They are written as well.  Discharged Condition: good  Discharge Exam: Blood pressure 130/67, pulse 78, temperature 98.5 F (36.9 C), temperature source Oral, resp. rate 17, height 5\' 10"  (1.778 m), weight 90.7 kg, SpO2 97%.  General: Pt awake/alert/oriented x4 in No acute distress Eyes: PERRL, normal EOM.  Sclera clear.  No icterus Neuro: CN II-XII intact w/o focal sensory/motor deficits. Lymph: No head/neck/groin lymphadenopathy Psych:  No  delerium/psychosis/paranoia HENT: Normocephalic, Mucus membranes moist.  No thrush Neck: Supple, No tracheal deviation Chest:  No chest wall pain w good excursion CV:  Pulses intact.  Regular rhythm MS: Normal AROM mjr joints.  No obvious deformity Abdomen: Soft.  Nondistended.  Mildly tender at incisions only.  No evidence of peritonitis.  No incarcerated hernias. Ext:  SCDs BLE.  No mjr edema.  No cyanosis Skin: No petechiae / purpura   Disposition:    Follow-up Information     [facility] Follow up in 3 week(s).   Specialty: General Surgery Why: To follow up after your operation Contact information: [address] [place]  [phone]                Discharge disposition: 01-Home or Self Care       Discharge Instructions     Call MD for:   Complete by: As directed    FEVER > 101.5 F  (temperatures < 101.5 F are not significant)   Call MD for:  extreme fatigue   Complete by: As directed    Call MD for:  persistant dizziness or light-headedness   Complete by: As directed    Call MD for:  persistant nausea and vomiting   Complete by: As directed    Call MD for:  redness, tenderness, or signs of infection (pain, swelling, redness, odor or green/yellow discharge around incision site)   Complete by: As directed    Call MD for:  severe uncontrolled pain   Complete by: As directed    Diet - low sodium heart healthy   Complete by: As directed    Start with a bland diet such as soups, liquids,  starchy foods, low fat foods, etc. the first few days at home. Gradually advance to a solid, low-fat, high fiber diet by the end of the first week at home.   Add a fiber supplement to your diet (Metamucil, etc) If you feel full, bloated, or constipated, stay on a full liquid or pureed/blenderized diet for a few days until you feel better and are no longer constipated.   Discharge instructions   Complete by: As  directed    See Discharge Instructions If you are not getting better after two weeks or are noticing you are getting worse, contact our office [phone] for further advice.  We may need to adjust your medications, re-evaluate you in the office, send you to the emergency room, or see what other things we can do to help. The clinic staff is available to answer your questions during regular business hours (8:30am-5pm).  Please don't hesitate to call and ask to speak to one of our nurses for clinical concerns.    A surgeon from [facility] is always on call at the hospitals 24 hours/day If you have a medical emergency, go to the nearest emergency room or call 911.   Discharge wound care:   Complete by: As directed    It is good for closed incisions and even open wounds to be washed every day.  Shower every day.  Short baths are fine.  Wash the incisions and wounds clean with soap & water.    You may leave closed incisions open to air if it is dry.   You may cover the incision with clean gauze & replace it after your daily shower for comfort.  TEGADERM:  You have clear gauze band-aid dressings over your closed incision(s).  Remove the dressings 2 days after surgery.   Driving Restrictions   Complete by: As directed    You may drive when: - you are no longer taking narcotic prescription pain medication - you can comfortably wear a seatbelt - you can safely make sudden turns/stops without pain.   Increase activity slowly   Complete by: As directed    Start light daily activities --- self-care, walking, climbing stairs- beginning the day after surgery.  Gradually increase activities as tolerated.  Control your pain to be active.  Stop when you are tired.  Ideally, walk several times a day, eventually an hour a day.   Most people are back to most day-to-day activities in a few weeks.  It takes 4-6 weeks to get back to unrestricted, intense activity. If you can walk 30 minutes without  difficulty, it is safe to try more intense activity such as jogging, treadmill, bicycling, low-impact aerobics, swimming, etc. Save the most intensive and strenuous activity for last (Usually 4-8 weeks after surgery) such as sit-ups, heavy lifting, contact sports, etc.  Refrain from any intense heavy lifting or straining until you are off narcotics for pain control.  You will have off days, but things should improve week-by-week. DO NOT PUSH THROUGH PAIN.  Let pain be your guide: If it hurts to do something, don't do it.   Lifting restrictions   Complete by: As directed    If you can walk 30 minutes without difficulty, it is safe to try more intense activity such as jogging, treadmill, bicycling, low-impact aerobics, swimming, etc. Save the most intensive and strenuous activity for last (Usually 4-8 weeks after surgery) such as sit-ups, heavy lifting, contact sports, etc.   Refrain from any intense heavy lifting  or straining until you are off narcotics for pain control.  You will have off days, but things should improve week-by-week. DO NOT PUSH THROUGH PAIN.  Let pain be your guide: If it hurts to do something, don't do it.  Pain is your body warning you to avoid that activity for another week until the pain goes down.   May shower / Bathe   Complete by: As directed    May walk up steps   Complete by: As directed    Remove dressing in 48 hours   Complete by: As directed    Make sure all dressings have been removed on the second day after surgery = 4/14 Monday Leave incisions open to air.  OK to cover incisions with gauze or bandages as desired   Sexual Activity Restrictions   Complete by: As directed    You may have sexual intercourse when it is comfortable. If it hurts to do something, stop.       Allergies as of 09/16/2023       Reactions   Grass Pollen(k-o-r-t-swt Vern) Itching, Other (See Comments)   Itchy eyes, sneezing, runny nose, some wheezing   Tree Extract Itching, Other (See  Comments)   Itchy eyes, sneezing, runny nose, some wheezing (from the pollen)        Medication List     STOP taking these medications    oxyCODONE 5 MG immediate release tablet Commonly known as: Oxy IR/ROXICODONE       TAKE these medications    acetaminophen 500 MG tablet Commonly known as: TYLENOL Take 1,000 mg by mouth every 6 (six) hours as needed for moderate pain.   cetirizine 10 MG tablet Commonly known as: ZYRTEC Take 10 mg by mouth in the morning.   famotidine 20 MG tablet Commonly known as: PEPCID Take 20 mg by mouth daily as needed for heartburn or indigestion.   fluticasone 50 MCG/ACT nasal spray Commonly known as: FLONASE Place 2 sprays into both nostrils daily as needed for allergies or rhinitis.   hydrocortisone 2.5 % rectal cream Commonly known as: ANUSOL-HC Place 1 Application rectally 2 (two) times daily as needed for hemorrhoids or anal itching.   Mens 50+ Multi Vitamin/Min Tabs Take 1 tablet by mouth daily with breakfast.   methocarbamol 500 MG tablet Commonly known as: ROBAXIN Take 1 tablet (500 mg total) by mouth every 6 (six) hours as needed for muscle spasms.   naproxen sodium 220 MG tablet Commonly known as: ALEVE Take 220 mg by mouth 2 (two) times daily as needed (for pain).   psyllium 58.6 % packet Commonly known as: METAMUCIL Take 1 packet by mouth daily as needed (for constipation- MIX AS DIRECTED).   traMADol 50 MG tablet Commonly known as: ULTRAM Take 1-2 tablets (50-100 mg total) by mouth every 6 (six) hours as needed for moderate pain (pain score 4-6) or severe pain (pain score 7-10).   valACYclovir 500 MG tablet Commonly known as: VALTREX Take 500 mg by mouth 2 (two) times daily as needed (as directed for fever blisters).               Discharge Care Instructions  (From admission, onward)           Start     Ordered   09/15/23 0000  Discharge wound care:       Comments: It is good for closed incisions  and even open wounds to be washed every day.  Shower every day.  Short  baths are fine.  Wash the incisions and wounds clean with soap & water.    You may leave closed incisions open to air if it is dry.   You may cover the incision with clean gauze & replace it after your daily shower for comfort.  TEGADERM:  You have clear gauze band-aid dressings over your closed incision(s).  Remove the dressings 2 days after surgery.   09/15/23 1139            Significant Diagnostic Studies:  Results for orders placed or performed during the hospital encounter of 09/14/23 (from the past 72 hours)  Lipase, blood     Status: None   Collection Time: 09/14/23  8:34 AM  Result Value Ref Range   Lipase 33 11 - 51 U/L    Comment: Performed at [facility], [address], [place], [place] [address]  Comprehensive metabolic panel     Status: Abnormal   Collection Time: 09/14/23  8:34 AM  Result Value Ref Range   Sodium 136 135 - 145 mmol/L   Potassium 4.1 3.5 - 5.1 mmol/L   Chloride 103 98 - 111 mmol/L   CO2 25 22 - 32 mmol/L   Glucose, Bld 122 (H) 70 - 99 mg/dL    Comment: Glucose reference range applies only to samples taken after fasting for at least 8 hours.   BUN 13 8 - 23 mg/dL   Creatinine, Ser 2.95 0.61 - 1.24 mg/dL   Calcium 9.3 8.9 - 62.1 mg/dL   Total Protein 7.2 6.5 - 8.1 g/dL   Albumin 4.1 3.5 - 5.0 g/dL   AST 27 15 - 41 U/L   ALT 39 0 - 44 U/L   Alkaline Phosphatase 43 38 - 126 U/L   Total Bilirubin 0.8 0.0 - 1.2 mg/dL   GFR, Estimated >30 >86 mL/min    Comment: (NOTE) Calculated using the CKD-EPI Creatinine Equation (2021)    Anion gap 8 5 - 15    Comment: Performed at [facility], [address] [place], [place] [address]  CBC     Status: Abnormal   Collection Time: 09/14/23  8:34 AM  Result Value Ref Range   WBC 11.8 (H) 4.0 - 10.5 K/uL   RBC 4.92 4.22 - 5.81 MIL/uL   Hemoglobin 14.9 13.0 - 17.0 g/dL   HCT 96.2 95.2 - 84.1 %   MCV  91.7 80.0 - 100.0 fL   MCH 30.3 26.0 - 34.0 pg   MCHC 33.0 30.0 - 36.0 g/dL   RDW 32.4 40.1 - 02.7 %   Platelets 222 150 - 400 K/uL   nRBC 0.0 0.0 - 0.2 %    Comment: Performed at [facility], [address] [place], [place] [address]  Differential     Status: Abnormal   Collection Time: 09/14/23  8:34 AM  Result Value Ref Range   Neutrophils Relative % 78 %   Neutro Abs 9.0 (H) 1.7 - 7.7 K/uL   Lymphocytes Relative 13 %   Lymphs Abs 1.5 0.7 - 4.0 K/uL   Monocytes Relative 7 %   Monocytes Absolute 0.9 0.1 - 1.0 K/uL   Eosinophils Relative 1 %   Eosinophils Absolute 0.1 0.0 - 0.5 K/uL   Basophils Relative 1 %   Basophils Absolute 0.1 0.0 - 0.1 K/uL   Immature Granulocytes 0 %   Abs Immature Granulocytes 0.05 0.00 - 0.07 K/uL    Comment: Performed at [facility], [address], [place], [place] [address]  Urinalysis, Routine w reflex microscopic -Urine, Random     Status: Abnormal   Collection Time: 09/14/23  8:49 AM  Result Value Ref Range   Color, Urine YELLOW YELLOW   APPearance CLEAR CLEAR   Specific Gravity, Urine 1.014 1.005 - 1.030   pH 5.0 5.0 - 8.0   Glucose, UA NEGATIVE NEGATIVE mg/dL   Hgb urine dipstick SMALL (A) NEGATIVE   Bilirubin Urine NEGATIVE NEGATIVE   Ketones, ur NEGATIVE NEGATIVE mg/dL   Protein, ur NEGATIVE NEGATIVE mg/dL   Nitrite NEGATIVE NEGATIVE   Leukocytes,Ua NEGATIVE NEGATIVE   RBC / HPF 0-5 0 - 5 RBC/hpf   WBC, UA 0-5 0 - 5 WBC/hpf   Bacteria, UA NONE SEEN NONE SEEN   Squamous Epithelial / HPF 0-5 0 - 5 /HPF   Mucus PRESENT     Comment: Performed at [facility], [address], [place], [place] [address]  Hepatic function panel     Status: None   Collection Time: 09/15/23  5:20 AM  Result Value Ref Range   Total Protein 6.5 6.5 - 8.1 g/dL   Albumin 3.5 3.5 - 5.0 g/dL   AST 22 15 - 41 U/L   ALT 29 0 - 44 U/L   Alkaline Phosphatase 41 38 - 126 U/L   Total Bilirubin 1.1 0.0 - 1.2  mg/dL   Bilirubin, Direct 0.2 0.0 - 0.2 mg/dL   Indirect Bilirubin 0.9 0.3 - 0.9 mg/dL    Comment: Performed at [facility], [address], [place], [place] [address]  Surgical PCR screen     Status: None   Collection Time: 09/15/23  5:48 AM   Specimen: Nasal Mucosa; Nasal Swab  Result Value Ref Range   MRSA, PCR NEGATIVE NEGATIVE   Staphylococcus aureus NEGATIVE NEGATIVE    Comment: (NOTE) The Xpert SA Assay (FDA approved for NASAL specimens in patients 39 years of age and older), is one component of a comprehensive surveillance program. It is not intended to diagnose infection nor to guide or monitor treatment. Performed at [facility], [address], [place], [place] [address]     MR ABDOMEN MRCP W WO CONTAST Result Date: 09/14/2023 CLINICAL DATA:  RUQ abdominal pain, biliary disease suspected, US  nondiagnostic; 144615 Pain 144615 EXAM: MRI ABDOMEN WITHOUT AND WITH CONTRAST (INCLUDING MRCP) TECHNIQUE: Multiplanar multisequence MR imaging of the abdomen was performed both before and after the administration of intravenous contrast. Heavily T2-weighted images of the biliary and pancreatic ducts were obtained, and three-dimensional MRCP images were rendered by post processing. CONTRAST:  9mL GADAVIST GADOBUTROL 1 MMOL/ML IV SOLN COMPARISON:  Ultrasound and CT scan abdomen from earlier the same day. FINDINGS: Lower chest: Unremarkable MR appearance to the lung bases. No pleural effusion. No pericardial effusion. Normal heart size. Hepatobiliary: The liver is normal in size and configuration. There is marked diffuse hepatic steatosis. No intrahepatic or extrahepatic bile duct dilatation. No choledocholithiasis. The gallbladder is physiologically distended (diameter up to 3.5 cm). There is mild-to-moderate diffuse gallbladder wall thickening/edema and mild-to-moderate pericholecystic fat stranding. There is also gallbladder mucosal hyperenhancement. Small  to moderate volume dependent gallstones and sludge noted. There is lobulation along the fundus of the gallbladder which is of unknown etiology. Differential diagnosis includes xanthogranulomatous cholecystitis. Pancreas: No mass, inflammatory changes or other parenchymal abnormality identified. No main pancreatic duct dilation. Spleen:  Within normal limits in size and appearance. No focal mass. Adrenals/Urinary Tract:  Unremarkable adrenal glands. No hydroureteronephrosis. No suspicious renal mass. Stomach/Bowel: Visualized portions within the abdomen are unremarkable. No disproportionate dilation of bowel loops. Unremarkable appendix. Vascular/Lymphatic: No pathologically enlarged lymph nodes identified. No abdominal aortic aneurysm demonstrated. No ascites. Other:  None. Musculoskeletal: No suspicious bone lesions identified. IMPRESSION: 1. Findings favor acute cholecystitis. There is lobulation along the fundus of the gallbladder which is of unknown etiology. Differential diagnosis includes xanthogranulomatous cholecystitis. 2. No intrahepatic or extrahepatic bile duct dilation. No choledocholithiasis. 3. Multiple other nonacute observations, as described above. Electronically Signed   By: Jules Schick M.D.   On: 09/14/2023 16:13   MR 3D Recon At Scanner Result Date: 09/14/2023 CLINICAL DATA:  RUQ abdominal pain, biliary disease suspected, [place] nondiagnostic; 409811 Pain 144615 EXAM: MRI ABDOMEN WITHOUT AND WITH CONTRAST (INCLUDING MRCP) TECHNIQUE: Multiplanar multisequence MR imaging of the abdomen was performed both before and after the administration of intravenous contrast. Heavily T2-weighted images of the biliary and pancreatic ducts were obtained, and three-dimensional MRCP images were rendered by post processing. CONTRAST:  9mL GADAVIST GADOBUTROL 1 MMOL/ML IV SOLN COMPARISON:  Ultrasound and CT scan abdomen from earlier the same day. FINDINGS: Lower chest: Unremarkable MR appearance to the lung bases.  No pleural effusion. No pericardial effusion. Normal heart size. Hepatobiliary: The liver is normal in size and configuration. There is marked diffuse hepatic steatosis. No intrahepatic or extrahepatic bile duct dilatation. No choledocholithiasis. The gallbladder is physiologically distended (diameter up to 3.5 cm). There is mild-to-moderate diffuse gallbladder wall thickening/edema and mild-to-moderate pericholecystic fat stranding. There is also gallbladder mucosal hyperenhancement. Small to moderate volume dependent gallstones and sludge noted. There is lobulation along the fundus of the gallbladder which is of unknown etiology. Differential diagnosis includes xanthogranulomatous cholecystitis. Pancreas: No mass, inflammatory changes or other parenchymal abnormality identified. No main pancreatic duct dilation. Spleen:  Within normal limits in size and appearance. No focal mass. Adrenals/Urinary Tract: Unremarkable adrenal glands. No hydroureteronephrosis. No suspicious renal mass. Stomach/Bowel: Visualized portions within the abdomen are unremarkable. No disproportionate dilation of bowel loops. Unremarkable appendix. Vascular/Lymphatic: No pathologically enlarged lymph nodes identified. No abdominal aortic aneurysm demonstrated. No ascites. Other:  None. Musculoskeletal: No suspicious bone lesions identified. IMPRESSION: 1. Findings favor acute cholecystitis. There is lobulation along the fundus of the gallbladder which is of unknown etiology. Differential diagnosis includes xanthogranulomatous cholecystitis. 2. No intrahepatic or extrahepatic bile duct dilation. No choledocholithiasis. 3. Multiple other nonacute observations, as described above. Electronically Signed   By: Jules Schick M.D.   On: 09/14/2023 16:13   US Abdomen Limited RUQ (LIVER/GB) Result Date: 09/14/2023 CLINICAL DATA:  Right lower quadrant pain EXAM: ULTRASOUND ABDOMEN LIMITED RIGHT UPPER QUADRANT COMPARISON:  CT earlier 09/14/2023.  FINDINGS: Gallbladder: Irregular contour to the distended gallbladder. There is a rounded structure within the gallbladder lumen measuring 14 mm but this is not shadow. Diffuse wall thickening. No Murphy's sign reported. Common bile duct: Diameter: 3 mm Liver: Diffusely echogenic hepatic parenchyma consistent with fatty liver infiltration. Portal vein is patent on color Doppler imaging with normal direction of blood flow towards the liver. Other: None. IMPRESSION: Irregular contour to the distended gallbladder with wall thickening. There is also echogenic lobular area in the gallbladder without clear shadowing measuring 14 mm. There are some separate small stones. This would have a differential including an and acute infectious or inflammatory process versus a more aggressive process such as a neoplastic process. Recommend further evaluation with MRI with and without contrast when appropriate dynamic Electronically Signed   By:  Adrianna Horde M.D.   On: 09/14/2023 11:39   CT ABDOMEN PELVIS W CONTRAST Result Date: 09/14/2023 CLINICAL DATA:  Abdominal pain, worsening yesterday and last night. Epigastric centered. EXAM: CT ABDOMEN AND PELVIS WITH CONTRAST TECHNIQUE: Multidetector CT imaging of the abdomen and pelvis was performed using the standard protocol following bolus administration of intravenous contrast. RADIATION DOSE REDUCTION: This exam was performed according to the departmental dose-optimization program which includes automated exposure control, adjustment of the mA and/or kV according to patient size and/or use of iterative reconstruction technique. CONTRAST:  OMNIPAQUE IOHEXOL 300 MG/ML  SOLN COMPARISON:  03/15/2022 FINDINGS: Lower chest: Posterior lingular scarring. Normal heart size without pericardial or pleural effusion. Hepatobiliary: Mild hepatic steatosis with sparing adjacent the gallbladder. Subcentimeter posterior segment 2-3 low-density lesion is likely a cyst. Suspect edema adjacent  the gallbladder neck including on 22/11. No calcified stone or biliary duct dilatation. Pancreas: Normal, without mass or ductal dilatation. Spleen: Normal in size, without focal abnormality. Adrenals/Urinary Tract: Normal adrenal glands. Normal kidneys, without hydronephrosis. Normal urinary bladder. Degraded evaluation of the pelvis, secondary to beam hardening artifact from left hip arthroplasty. Stomach/Bowel: Normal stomach, without wall thickening. Normal colon, appendix, and terminal ileum. Normal small bowel. Vascular/Lymphatic: Aortic atherosclerosis. No abdominopelvic adenopathy. Reproductive: Normal prostate for age. Other: No significant free fluid. No free intraperitoneal air. Small fat containing paraumbilical hernia. Tiny fat containing right inguinal hernia. Musculoskeletal: left hip arthroplasty. Mild right hip osteoarthritis. L4-5 and L5-S1 disc bulges IMPRESSION: 1. Findings suspicious for acute cholecystitis. Correlate with right upper quadrant symptoms and consider ultrasound. 2. No other explanation for patient's symptoms; normal appendix. 3.  Aortic Atherosclerosis (ICD10-I70.0). Electronically Signed   By: Lore Rode M.D.   On: 09/14/2023 10:25    Past Medical History:  Diagnosis Date   Cancer (HCC)    basal cell   DDD (degenerative disc disease), lumbosacral    Dental bridge present    lower   Environmental allergies    pollen, grass, trees   GERD (gastroesophageal reflux disease)    History of asthma    > 30 years ago   Immature cataract of both eyes    OA (osteoarthritis)    bilateral hand   Osteochondral lesion of talar dome 08/2017   right   Pneumonia    Sinus headache    Urinary frequency     Past Surgical History:  Procedure Laterality Date   ANKLE ARTHROSCOPY WITH DRILLING/MICROFRACTURE Right 08/23/2017   Procedure: Ankle Arthroscopy with Extensive Debridement and Grafting of the Osteochondral Lesions of Talus;  Surgeon: Amada Backer, MD;  Location: [facility];  Service: Orthopedics;  Laterality: Right;   COLONOSCOPY  03/2002   DENTAL SURGERY     LAPAROSCOPIC CHOLECYSTECTOMY SINGLE PORT N/A 09/15/2023   Procedure: LAPAROSCOPIC CHOLECYSTECTOMY SINGLE SITE;  Surgeon: Candyce Champagne, MD;  Location: [facility];  Service: General;  Laterality: N/A;   TOTAL HIP ARTHROPLASTY Left 01/19/2023   Procedure: LEFT TOTAL HIP ARTHROPLASTY ANTERIOR APPROACH;  Surgeon: Arnie Lao, MD;  Location: [facility];  Service: Orthopedics;  Laterality: Left;   VASECTOMY      Social History   Socioeconomic History   Marital status: Married    Spouse name: Not on file   Number of children: 3   Years of education: Not on file   Highest education level: Not on file  Occupational History   Not on file  Tobacco Use   Smoking status: Former    Current packs/day: 0.00  Types: Cigarettes    Quit date: 06/05/1983    Years since quitting: 40.3   Smokeless tobacco: Never  Vaping Use   Vaping status: Never Used  Substance and Sexual Activity   Alcohol use: Yes    Comment: 1-2 beers/day   Drug use: No    Comment: gummies   Sexual activity: Not on file  Other Topics Concern   Not on file  Social History Narrative   Not on file   Social Drivers of Health   Financial Resource Strain: Not on file  Food Insecurity: No Food Insecurity (09/15/2023)   Hunger Vital Sign    Worried About Running Out of Food in the Last Year: Never true    Ran Out of Food in the Last Year: Never true  Transportation Needs: No Transportation Needs (09/15/2023)   PRAPARE - Administrator, Civil Service (Medical): No    Lack of Transportation (Non-Medical): No  Physical Activity: Not on file  Stress: Not on file  Social Connections: Socially Integrated (09/15/2023)   Social Connection and Isolation Panel [NHANES]    Frequency of Communication with Friends and Family: More than three times a week    Frequency of Social Gatherings with Friends and Family:  Twice a week    Attends Religious Services: 1 to 4 times per year    Active Member of [employer] or Organizations: No    Attends Engineer, structural: 1 to 4 times per year    Marital Status: Married  Catering manager Violence: Not At Risk (09/15/2023)   Humiliation, Afraid, Rape, and Kick questionnaire    Fear of Current or Ex-Partner: No    Emotionally Abused: No    Physically Abused: No    Sexually Abused: No    Family History  Problem Relation Age of Onset   Diabetes Mother    Breast cancer Mother    Heart attack Father 75   Breast cancer Sister    CVA Maternal Grandmother 30   Colon cancer Maternal Uncle    Melanoma Daughter     Current Facility-Administered Medications  Medication Dose Route Frequency Provider Last Rate Last Admin   0.9 %  sodium chloride infusion  250 mL Intravenous PRN Candyce Champagne, MD       acetaminophen (TYLENOL) tablet 650 mg  650 mg Oral Q6H PRN Candyce Champagne, MD   650 mg at 09/16/23 0136   Or   acetaminophen (TYLENOL) suppository 650 mg  650 mg Rectal Q6H PRN Candyce Champagne, MD       bisacodyl (DULCOLAX) suppository 10 mg  10 mg Rectal Q12H PRN Candyce Champagne, MD       Chlorhexidine Gluconate Cloth 2 % PADS 6 each  6 each Topical Once Candyce Champagne, MD       diphenhydrAMINE (BENADRYL) 12.5 MG/5ML elixir 12.5 mg  12.5 mg Oral Q6H PRN Candyce Champagne, MD       Or   diphenhydrAMINE (BENADRYL) injection 12.5 mg  12.5 mg Intravenous Q6H PRN Candyce Champagne, MD       famotidine (PEPCID) tablet 20 mg  20 mg Oral Daily PRN Candyce Champagne, MD       fluticasone (FLONASE) 50 MCG/ACT nasal spray 2 spray  2 spray Each Nare Daily PRN Candyce Champagne, MD       gabapentin (NEURONTIN) capsule 300 mg  300 mg Oral QHS Maura Braaten, MD   300 mg at 09/15/23 2118   heparin injection 5,000 Units  5,000 Units Subcutaneous  Lindley Rhein, MD   5,000 Units at 09/16/23 4098   hydrocortisone (ANUSOL-HC) 2.5 % rectal cream 1 Application  1 Application Rectal BID PRN Candyce Champagne, MD       HYDROmorphone (DILAUDID) injection 0.5-2 mg  0.5-2 mg Intravenous Q2H PRN Candyce Champagne, MD   1 mg at 09/15/23 1832   lactated ringers bolus 1,000 mL  1,000 mL Intravenous Q8H PRN Candyce Champagne, MD       lactated ringers bolus 1,000 mL  1,000 mL Intravenous Q8H PRN Candyce Champagne, MD       lactated ringers infusion   Intravenous Continuous Candyce Champagne, MD 100 mL/hr at 09/14/23 2337 New Bag at 09/14/23 2337   loratadine (CLARITIN) tablet 10 mg  10 mg Oral Daily Candyce Champagne, MD   10 mg at 09/16/23 1191   magic mouthwash  15 mL Oral QID PRN Candyce Champagne, MD       menthol-cetylpyridinium (CEPACOL) lozenge 3 mg  1 lozenge Oral PRN Candyce Champagne, MD       methocarbamol (ROBAXIN) injection 1,000 mg  1,000 mg Intravenous Q6H PRN Candyce Champagne, MD       methocarbamol (ROBAXIN) tablet 1,000 mg  1,000 mg Oral Q6H PRN Candyce Champagne, MD   1,000 mg at 09/16/23 0136   metoprolol tartrate (LOPRESSOR) injection 5 mg  5 mg Intravenous Q6H PRN Candyce Champagne, MD       multivitamin with minerals tablet 1 tablet  1 tablet Oral Daily Candyce Champagne, MD   1 tablet at 09/16/23 4782   naphazoline-glycerin (CLEAR EYES REDNESS) ophth solution 1-2 drop  1-2 drop Both Eyes QID PRN Candyce Champagne, MD       naproxen (NAPROSYN) tablet 250 mg  250 mg Oral Daily PRN Candyce Champagne, MD       ondansetron (ZOFRAN-ODT) disintegrating tablet 4 mg  4 mg Oral Q6H PRN Candyce Champagne, MD       Or   ondansetron (ZOFRAN) injection 4 mg  4 mg Intravenous Q6H PRN Candyce Champagne, MD       phenol (CHLORASEPTIC) mouth spray 2 spray  2 spray Mouth/Throat PRN Candyce Champagne, MD       piperacillin-tazobactam (ZOSYN) IVPB 3.375 g  3.375 g Intravenous Q8H Vallie Fayette, MD 12.5 mL/hr at 09/16/23 0140 3.375 g at 09/16/23 0140   polycarbophil (FIBERCON) tablet 625 mg  625 mg Oral BID Candyce Champagne, MD   625 mg at 09/16/23 9562   prochlorperazine (COMPAZINE) tablet 10 mg  10 mg Oral Q6H PRN Candyce Champagne, MD       Or   prochlorperazine  (COMPAZINE) injection 5-10 mg  5-10 mg Intravenous Q6H PRN Candyce Champagne, MD       simethicone (MYLICON) chewable tablet 40 mg  40 mg Oral Q6H PRN Candyce Champagne, MD   40 mg at 09/14/23 1833   sodium chloride (OCEAN) 0.65 % nasal spray 1-2 spray  1-2 spray Each Nare Q6H PRN Candyce Champagne, MD       sodium chloride flush (NS) 0.9 % injection 3 mL  3 mL Intravenous Q12H Landynn Dupler, MD   3 mL at 09/15/23 2121   sodium chloride flush (NS) 0.9 % injection 3 mL  3 mL Intravenous PRN Candyce Champagne, MD       traMADol [place] Sick) tablet 50-100 mg  50-100 mg Oral Q6H PRN Candyce Champagne, MD   100 mg at 09/16/23 0136     Allergies  Allergen Reactions   Grass Pollen(K-O-R-T-Swt Vern) Itching and  Other (See Comments)    Itchy eyes, sneezing, runny nose, some wheezing   Tree Extract Itching and Other (See Comments)    Itchy eyes, sneezing, runny nose, some wheezing (from the pollen)    Signed:   Eddye Goodie, MD, FACS, MASCRS Esophageal, Gastrointestinal & Colorectal Surgery Robotic and Minimally Invasive Surgery  [facility] [address] [place], [place] [address] [phone] Fax [phone] Main  CONTACT INFORMATION: Weekday (9AM-5PM): Call [facility] main office at [phone] Weeknight (5PM-9AM) or Weekend/Holiday: Check EPIC "Web Links" tab & use "AMION" (password " [email or web address]") for General Surgery [facility] coverage  Please, DO NOT use SecureChat  (it is not reliable communication to reach operating surgeons & will lead to a delay in care).   Epic staff messaging available for outptient concerns needing 1-2 business day response.      09/16/2023, 10:22 AM

## 2023-09-16 NOTE — Plan of Care (Signed)

## 2023-09-16 NOTE — Progress Notes (Signed)
   09/16/23 0926  TOC Brief Assessment  Insurance and Status Reviewed  Patient has primary care physician Yes  Home environment has been reviewed home w/ wife  Prior level of function: mod independent  Prior/Current Home Services No current home services  Social Drivers of Health Review SDOH reviewed no interventions necessary  Readmission risk has been reviewed Yes  Transition of care needs no transition of care needs at this time

## 2023-09-16 NOTE — Plan of Care (Signed)

## 2023-09-16 NOTE — Progress Notes (Signed)
Reviewed written d/c instructions w pt and his wife, all questions answered and they both verbalized understanding. D/C via w/c w all belongings in stable condition.

## 2023-09-18 LAB — SURGICAL PATHOLOGY

## 2023-09-25 DIAGNOSIS — K810 Acute cholecystitis: Secondary | ICD-10-CM | POA: Diagnosis not present

## 2023-09-25 DIAGNOSIS — Z9049 Acquired absence of other specified parts of digestive tract: Secondary | ICD-10-CM | POA: Diagnosis not present

## 2023-09-25 DIAGNOSIS — K429 Umbilical hernia without obstruction or gangrene: Secondary | ICD-10-CM | POA: Diagnosis not present

## 2023-09-25 DIAGNOSIS — J45990 Exercise induced bronchospasm: Secondary | ICD-10-CM | POA: Diagnosis not present

## 2023-09-27 DIAGNOSIS — Z961 Presence of intraocular lens: Secondary | ICD-10-CM | POA: Diagnosis not present

## 2023-09-27 DIAGNOSIS — H25811 Combined forms of age-related cataract, right eye: Secondary | ICD-10-CM | POA: Diagnosis not present

## 2023-09-27 DIAGNOSIS — H2511 Age-related nuclear cataract, right eye: Secondary | ICD-10-CM | POA: Diagnosis not present

## 2023-10-04 DIAGNOSIS — Z961 Presence of intraocular lens: Secondary | ICD-10-CM | POA: Diagnosis not present

## 2023-10-04 DIAGNOSIS — H2512 Age-related nuclear cataract, left eye: Secondary | ICD-10-CM | POA: Diagnosis not present

## 2023-10-04 DIAGNOSIS — H25812 Combined forms of age-related cataract, left eye: Secondary | ICD-10-CM | POA: Diagnosis not present

## 2023-11-21 DIAGNOSIS — L821 Other seborrheic keratosis: Secondary | ICD-10-CM | POA: Diagnosis not present

## 2023-11-21 DIAGNOSIS — L814 Other melanin hyperpigmentation: Secondary | ICD-10-CM | POA: Diagnosis not present

## 2023-11-21 DIAGNOSIS — Z85828 Personal history of other malignant neoplasm of skin: Secondary | ICD-10-CM | POA: Diagnosis not present

## 2023-11-21 DIAGNOSIS — D485 Neoplasm of uncertain behavior of skin: Secondary | ICD-10-CM | POA: Diagnosis not present

## 2023-11-21 DIAGNOSIS — D1801 Hemangioma of skin and subcutaneous tissue: Secondary | ICD-10-CM | POA: Diagnosis not present

## 2023-11-21 DIAGNOSIS — D229 Melanocytic nevi, unspecified: Secondary | ICD-10-CM | POA: Diagnosis not present

## 2023-11-21 DIAGNOSIS — L578 Other skin changes due to chronic exposure to nonionizing radiation: Secondary | ICD-10-CM | POA: Diagnosis not present

## 2024-02-05 DIAGNOSIS — R140 Abdominal distension (gaseous): Secondary | ICD-10-CM | POA: Diagnosis not present

## 2024-02-05 DIAGNOSIS — R1013 Epigastric pain: Secondary | ICD-10-CM | POA: Diagnosis not present

## 2024-02-05 DIAGNOSIS — R82998 Other abnormal findings in urine: Secondary | ICD-10-CM | POA: Diagnosis not present

## 2024-02-07 ENCOUNTER — Other Ambulatory Visit (HOSPITAL_BASED_OUTPATIENT_CLINIC_OR_DEPARTMENT_OTHER): Payer: Self-pay | Admitting: Physician Assistant

## 2024-02-07 DIAGNOSIS — R748 Abnormal levels of other serum enzymes: Secondary | ICD-10-CM

## 2024-02-07 DIAGNOSIS — R7401 Elevation of levels of liver transaminase levels: Secondary | ICD-10-CM

## 2024-02-07 DIAGNOSIS — R1013 Epigastric pain: Secondary | ICD-10-CM

## 2024-02-08 ENCOUNTER — Ambulatory Visit (HOSPITAL_BASED_OUTPATIENT_CLINIC_OR_DEPARTMENT_OTHER)
Admission: RE | Admit: 2024-02-08 | Discharge: 2024-02-08 | Disposition: A | Discharge status: Home / Self Care | Source: Ambulatory Visit | Attending: Physician Assistant | Admitting: Physician Assistant

## 2024-02-08 ENCOUNTER — Encounter (HOSPITAL_BASED_OUTPATIENT_CLINIC_OR_DEPARTMENT_OTHER): Payer: Self-pay

## 2024-02-08 DIAGNOSIS — R7401 Elevation of levels of liver transaminase levels: Secondary | ICD-10-CM | POA: Diagnosis not present

## 2024-02-08 DIAGNOSIS — R748 Abnormal levels of other serum enzymes: Secondary | ICD-10-CM | POA: Insufficient documentation

## 2024-02-08 DIAGNOSIS — R1013 Epigastric pain: Secondary | ICD-10-CM | POA: Diagnosis not present

## 2024-02-08 DIAGNOSIS — K760 Fatty (change of) liver, not elsewhere classified: Secondary | ICD-10-CM | POA: Diagnosis not present

## 2024-02-08 MED ORDER — IOHEXOL 300 MG/ML  SOLN
100.0000 mL | Freq: Once | INTRAMUSCULAR | Status: AC | PRN
  Administered 2024-02-08: 100 mL via INTRAVENOUS

## 2024-02-11 DIAGNOSIS — R748 Abnormal levels of other serum enzymes: Secondary | ICD-10-CM | POA: Diagnosis not present

## 2024-02-20 DIAGNOSIS — K859 Acute pancreatitis without necrosis or infection, unspecified: Secondary | ICD-10-CM | POA: Diagnosis not present

## 2024-02-20 DIAGNOSIS — Z9049 Acquired absence of other specified parts of digestive tract: Secondary | ICD-10-CM | POA: Diagnosis not present

## 2024-02-20 DIAGNOSIS — K760 Fatty (change of) liver, not elsewhere classified: Secondary | ICD-10-CM | POA: Diagnosis not present

## 2024-02-20 DIAGNOSIS — R1010 Upper abdominal pain, unspecified: Secondary | ICD-10-CM | POA: Diagnosis not present

## 2024-02-27 ENCOUNTER — Other Ambulatory Visit: Payer: Self-pay

## 2024-02-27 ENCOUNTER — Ambulatory Visit: Admitting: Orthopaedic Surgery

## 2024-02-27 ENCOUNTER — Encounter: Payer: Self-pay | Admitting: Orthopaedic Surgery

## 2024-02-27 ENCOUNTER — Telehealth: Payer: Self-pay | Admitting: *Deleted

## 2024-02-27 DIAGNOSIS — Z96642 Presence of left artificial hip joint: Secondary | ICD-10-CM | POA: Diagnosis not present

## 2024-02-27 DIAGNOSIS — R748 Abnormal levels of other serum enzymes: Secondary | ICD-10-CM

## 2024-02-27 DIAGNOSIS — G8929 Other chronic pain: Secondary | ICD-10-CM

## 2024-02-27 DIAGNOSIS — M25512 Pain in left shoulder: Secondary | ICD-10-CM

## 2024-02-27 MED ORDER — LIDOCAINE HCL 1 % IJ SOLN
3.0000 mL | INTRAMUSCULAR | Status: AC | PRN
  Administered 2024-02-27: 3 mL

## 2024-02-27 MED ORDER — METHYLPREDNISOLONE ACETATE 40 MG/ML IJ SUSP
40.0000 mg | INTRAMUSCULAR | Status: AC | PRN
  Administered 2024-02-27: 40 mg via INTRA_ARTICULAR

## 2024-02-27 NOTE — Telephone Encounter (Signed)
 1 year post op call for Left total hip completed.

## 2024-02-27 NOTE — Progress Notes (Signed)
 The patient is a 73 year-old gentleman who is now over a year out from a left total hip arthroplasty to treat significant left hip pain and arthritis.  He says he is doing well and has no issues with his hip at all.  He has had some left shoulder pain with activities reaching overhead and behind but no known injury.  It does not occur all the time but it does occur when he is doing certain activities.  Examination of the left shoulder does show some signs of impingement with excellent range of motion and good strength of the rotator cuff.  His left operative hip moves smoothly and fluidly with no blocks or rotation his right hip exam is also normal.  Imaging studies of the left hip and pelvis show the implant is in place with good positioning and no complicating features from his hip replacement a year ago.  At this point follow-up for the hip can be as needed.  I did recommend a steroid injection in his left shoulder subacromial outlet and he agreed to this and tolerated it well.  If his shoulder is not getting better he needs to come back and see us .  All questions concerns were answered and addressed.    Procedure Note  Patient: David Dillon             Date of Birth: [birth year]           MRN: [id number]             Visit Date: 02/27/2024  Procedures: Visit Diagnoses:  1. History of left hip replacement   2. Chronic left shoulder pain     Large Joint Inj: L subacromial bursa on 02/27/2024 9:34 AM Indications: pain and diagnostic evaluation Details: 22 G 1.5 in needle  Arthrogram: No  Medications: 3 mL lidocaine  1 %; 40 mg methylPREDNISolone  acetate 40 MG/ML Outcome: tolerated well, no immediate complications Procedure, treatment alternatives, risks and benefits explained, specific risks discussed. Consent was given by the patient. Immediately prior to procedure a time out was called to verify the correct patient, procedure, equipment, support staff and site/side marked as  required. Patient was prepped and draped in the usual sterile fashion.

## 2024-03-10 ENCOUNTER — Ambulatory Visit: Admission: RE | Admit: 2024-03-10 | Discharge: 2024-03-10 | Disposition: A | Source: Ambulatory Visit

## 2024-03-10 DIAGNOSIS — K760 Fatty (change of) liver, not elsewhere classified: Secondary | ICD-10-CM | POA: Diagnosis not present

## 2024-03-10 DIAGNOSIS — R748 Abnormal levels of other serum enzymes: Secondary | ICD-10-CM

## 2024-03-10 MED ORDER — GADOPICLENOL 0.5 MMOL/ML IV SOLN
10.0000 mL | Freq: Once | INTRAVENOUS | Status: AC | PRN
Start: 2024-03-10 — End: 2024-03-10
  Administered 2024-03-10: 9 mL via INTRAVENOUS

## 2024-03-31 DIAGNOSIS — R1084 Generalized abdominal pain: Secondary | ICD-10-CM | POA: Diagnosis not present

## 2024-03-31 DIAGNOSIS — R748 Abnormal levels of other serum enzymes: Secondary | ICD-10-CM | POA: Diagnosis not present

## 2024-04-07 ENCOUNTER — Encounter: Payer: Self-pay | Admitting: Radiology

## 2024-07-14 ENCOUNTER — Ambulatory Visit: Admitting: Orthopaedic Surgery

## 2024-07-30 ENCOUNTER — Ambulatory Visit: Admitting: Orthopaedic Surgery
